# Patient Record
Sex: Male | Born: 1950
Health system: Southern US, Community
[De-identification: ages and names within clinical notes are randomized; demographics above are authoritative.]

## PROBLEM LIST (undated history)

## (undated) DIAGNOSIS — M1990 Unspecified osteoarthritis, unspecified site: Secondary | ICD-10-CM

## (undated) DIAGNOSIS — R972 Elevated prostate specific antigen [PSA]: Secondary | ICD-10-CM

## (undated) DIAGNOSIS — H3530 Unspecified macular degeneration: Secondary | ICD-10-CM

## (undated) DIAGNOSIS — E079 Disorder of thyroid, unspecified: Secondary | ICD-10-CM

## (undated) DIAGNOSIS — G629 Polyneuropathy, unspecified: Secondary | ICD-10-CM

## (undated) DIAGNOSIS — J45909 Unspecified asthma, uncomplicated: Secondary | ICD-10-CM

## (undated) DIAGNOSIS — G709 Myoneural disorder, unspecified: Secondary | ICD-10-CM

## (undated) DIAGNOSIS — E669 Obesity, unspecified: Secondary | ICD-10-CM

## (undated) DIAGNOSIS — M2550 Pain in unspecified joint: Secondary | ICD-10-CM

## (undated) DIAGNOSIS — K219 Gastro-esophageal reflux disease without esophagitis: Secondary | ICD-10-CM

## (undated) DIAGNOSIS — E039 Hypothyroidism, unspecified: Secondary | ICD-10-CM

## (undated) DIAGNOSIS — T7840XA Allergy, unspecified, initial encounter: Secondary | ICD-10-CM

## (undated) DIAGNOSIS — G4730 Sleep apnea, unspecified: Secondary | ICD-10-CM

## (undated) DIAGNOSIS — R0602 Shortness of breath: Secondary | ICD-10-CM

## (undated) DIAGNOSIS — I10 Essential (primary) hypertension: Secondary | ICD-10-CM

## (undated) HISTORY — DX: Elevated prostate specific antigen (PSA): R97.20

## (undated) HISTORY — PX: TONSILLECTOMY: SUR1361

## (undated) HISTORY — DX: Obesity, unspecified: E66.9

## (undated) HISTORY — PX: ESOPHAGOGASTRODUODENOSCOPY: SHX1529

## (undated) HISTORY — DX: Unspecified macular degeneration: H35.30

## (undated) HISTORY — PX: ANKLE SURGERY: SHX546

## (undated) HISTORY — PX: REPLACEMENT TOTAL KNEE: SUR1224

## (undated) HISTORY — DX: Allergy, unspecified, initial encounter: T78.40XA

## (undated) HISTORY — DX: Pain in unspecified joint: M25.50

## (undated) HISTORY — DX: Shortness of breath: R06.02

## (undated) HISTORY — PX: HAND SURGERY: SHX662

---

## 2000-04-02 ENCOUNTER — Ambulatory Visit (HOSPITAL_COMMUNITY): Admission: RE | Admit: 2000-04-02 | Discharge: 2000-04-02 | Payer: Self-pay

## 2000-07-22 ENCOUNTER — Other Ambulatory Visit: Admission: RE | Admit: 2000-07-22 | Discharge: 2000-07-22 | Payer: Self-pay | Admitting: Urology

## 2006-02-21 ENCOUNTER — Encounter: Admission: RE | Admit: 2006-02-21 | Discharge: 2006-02-21 | Payer: Self-pay | Admitting: Orthopedic Surgery

## 2013-04-30 ENCOUNTER — Ambulatory Visit: Payer: Self-pay | Admitting: Emergency Medicine

## 2013-04-30 VITALS — BP 124/68 | HR 74 | Temp 98.3°F | Resp 16 | Ht 72.5 in | Wt 296.0 lb

## 2013-04-30 DIAGNOSIS — H6120 Impacted cerumen, unspecified ear: Secondary | ICD-10-CM

## 2013-04-30 DIAGNOSIS — H6122 Impacted cerumen, left ear: Secondary | ICD-10-CM

## 2013-04-30 NOTE — Progress Notes (Signed)
[facility] [address], [place] [address] [phone]  Date:  04/30/2013   Name:  James Henson   DOB:  1950-08-24   MRN:  [id number]  PCP:  Lillia Mountain, MD    Chief Complaint: Ears clogged   History of Present Illness:  James Henson is a 62 y.o. very pleasant male patient who presents with the following:  Concerned that he may have a wax build up in his ears as he is going to the beach this weekend.  Has seasonal allergic rhinitis.  No fever or chills. Watery drainage. No sore throat or cough.  No improvement with over the counter medications or other home remedies. Denies other complaint or health concern today.   There are no active problems to display for this patient.   Past Medical History  Diagnosis Date  . Allergy     Past Surgical History  Procedure Laterality Date  . Hand surgery      History  Substance Use Topics  . Smoking status: Never Smoker   . Smokeless tobacco: Not on file  . Alcohol Use: No    History reviewed. No pertinent family history.  No Known Allergies  Medication list has been reviewed and updated.  No current outpatient prescriptions on file prior to visit.   No current facility-administered medications on file prior to visit.    Review of Systems:  As per HPI, otherwise negative.    Physical Examination: Filed Vitals:   04/30/13 1507  BP: 124/68  Pulse: 74  Temp: 98.3 F (36.8 C)  Resp: 16   Filed Vitals:   04/30/13 1507  Height: 6' 0.5" (1.842 m)  Weight: 296 lb (134.265 kg)   Body mass index is 39.57 kg/(m^2). Ideal Body Weight: Weight in (lb) to have BMI = 25: 186.5   GEN: WDWN, NAD, Non-toxic, Alert & Oriented x 3 HEENT: Atraumatic, Normocephalic.  Ears and Nose: No external deformity.  Left cerumen impaction EXTR: No clubbing/cyanosis/edema NEURO: Normal gait.  PSYCH: Normally interactive. Conversant. Not depressed or anxious appearing.  Calm demeanor.     Assessment and Plan: Cerumen impaction Irrigated atraumatically   Signed,  Phillips Odor, MD

## 2013-04-30 NOTE — Patient Instructions (Addendum)
Cerumen Impaction A cerumen impaction is when the wax in your ear forms a plug. This plug usually causes reduced hearing. Sometimes it also causes an earache or dizziness. Removing a cerumen impaction can be difficult and painful. The wax sticks to the ear canal. The canal is sensitive and bleeds easily. If you try to remove a heavy wax buildup with a cotton tipped swab, you may push it in further. Irrigation with water, suction, and small ear curettes may be used to clear out the wax. If the impaction is fixed to the skin in the ear canal, ear drops may be needed for a few days to loosen the wax. People who build up a lot of wax frequently can use ear wax removal products available in your local drugstore. SEEK MEDICAL CARE IF:  You develop an earache, increased hearing loss, or marked dizziness. Document Released: 09/10/2004 Document Revised: 10/26/2011 Document Reviewed: 10/31/2009 [employer] Patient Information 2014 [employer].  

## 2013-06-30 ENCOUNTER — Emergency Department (HOSPITAL_COMMUNITY): Payer: Self-pay

## 2013-06-30 ENCOUNTER — Inpatient Hospital Stay (HOSPITAL_COMMUNITY)
Admission: EM | Admit: 2013-06-30 | Discharge: 2013-07-05 | DRG: 200 | Disposition: A | Discharge status: Home / Self Care | Payer: Self-pay | Attending: General Surgery | Admitting: General Surgery

## 2013-06-30 ENCOUNTER — Encounter (HOSPITAL_COMMUNITY): Payer: Self-pay | Admitting: Emergency Medicine

## 2013-06-30 DIAGNOSIS — G589 Mononeuropathy, unspecified: Secondary | ICD-10-CM | POA: Diagnosis present

## 2013-06-30 DIAGNOSIS — S270XXA Traumatic pneumothorax, initial encounter: Secondary | ICD-10-CM

## 2013-06-30 DIAGNOSIS — S129XXA Fracture of neck, unspecified, initial encounter: Secondary | ICD-10-CM

## 2013-06-30 DIAGNOSIS — S22009A Unspecified fracture of unspecified thoracic vertebra, initial encounter for closed fracture: Secondary | ICD-10-CM

## 2013-06-30 DIAGNOSIS — J939 Pneumothorax, unspecified: Secondary | ICD-10-CM | POA: Diagnosis present

## 2013-06-30 DIAGNOSIS — E871 Hypo-osmolality and hyponatremia: Secondary | ICD-10-CM | POA: Diagnosis present

## 2013-06-30 DIAGNOSIS — S12400A Unspecified displaced fracture of fifth cervical vertebra, initial encounter for closed fracture: Secondary | ICD-10-CM | POA: Diagnosis present

## 2013-06-30 DIAGNOSIS — S2249XA Multiple fractures of ribs, unspecified side, initial encounter for closed fracture: Secondary | ICD-10-CM | POA: Diagnosis present

## 2013-06-30 DIAGNOSIS — R339 Retention of urine, unspecified: Secondary | ICD-10-CM | POA: Diagnosis present

## 2013-06-30 DIAGNOSIS — S2242XA Multiple fractures of ribs, left side, initial encounter for closed fracture: Secondary | ICD-10-CM

## 2013-06-30 HISTORY — DX: Polyneuropathy, unspecified: G62.9

## 2013-06-30 HISTORY — DX: Disorder of thyroid, unspecified: E07.9

## 2013-06-30 LAB — POCT I-STAT, CHEM 8
BUN: 22 mg/dL (ref 6–23)
Calcium, Ion: 1.25 mmol/L (ref 1.13–1.30)
Creatinine, Ser: 1.3 mg/dL (ref 0.50–1.35)
Glucose, Bld: 116 mg/dL — ABNORMAL HIGH (ref 70–99)
Sodium: 138 mEq/L (ref 135–145)

## 2013-06-30 LAB — CBC WITH DIFFERENTIAL/PLATELET
Basophils Absolute: 0 10*3/uL (ref 0.0–0.1)
Eosinophils Absolute: 0.1 10*3/uL (ref 0.0–0.7)
HCT: 46.7 % (ref 39.0–52.0)
Hemoglobin: 16.5 g/dL (ref 13.0–17.0)
MCHC: 35.3 g/dL (ref 30.0–36.0)
RDW: 13 % (ref 11.5–15.5)
WBC: 14.9 10*3/uL — ABNORMAL HIGH (ref 4.0–10.5)

## 2013-06-30 LAB — COMPREHENSIVE METABOLIC PANEL
ALT: 107 U/L — ABNORMAL HIGH (ref 0–53)
AST: 78 U/L — ABNORMAL HIGH (ref 0–37)
Alkaline Phosphatase: 55 U/L (ref 39–117)
BUN: 20 mg/dL (ref 6–23)
CO2: 27 mEq/L (ref 19–32)
Calcium: 9.3 mg/dL (ref 8.4–10.5)
Potassium: 3.9 mEq/L (ref 3.5–5.1)
Sodium: 135 mEq/L (ref 135–145)
Total Bilirubin: 0.6 mg/dL (ref 0.3–1.2)

## 2013-06-30 MED ORDER — GABAPENTIN 300 MG PO CAPS
300.0000 mg | ORAL_CAPSULE | Freq: Two times a day (BID) | ORAL | Status: DC
  Administered 2013-07-01 – 2013-07-05 (×9): 300 mg via ORAL
  Filled 2013-06-30 (×11): qty 1

## 2013-06-30 MED ORDER — SODIUM CHLORIDE 0.9 % IJ SOLN: 9.0000 mL | INTRAMUSCULAR | Status: DC | PRN

## 2013-06-30 MED ORDER — MIDAZOLAM HCL 2 MG/2ML IJ SOLN
2.0000 mg | Freq: Once | INTRAMUSCULAR | Status: AC
  Administered 2013-06-30: 2 mg via INTRAVENOUS
  Filled 2013-06-30: qty 2

## 2013-06-30 MED ORDER — PANTOPRAZOLE SODIUM 40 MG IV SOLR
40.0000 mg | Freq: Every day | INTRAVENOUS | Status: DC
  Administered 2013-07-01: 40 mg via INTRAVENOUS
  Filled 2013-06-30 (×4): qty 40

## 2013-06-30 MED ORDER — KCL IN DEXTROSE-NACL 20-5-0.45 MEQ/L-%-% IV SOLN
INTRAVENOUS | Status: DC
  Administered 2013-07-01: 01:00:00 via INTRAVENOUS
  Administered 2013-07-01: 75 mL via INTRAVENOUS
  Administered 2013-07-02: 05:00:00 via INTRAVENOUS
  Filled 2013-06-30 (×5): qty 1000

## 2013-06-30 MED ORDER — FLUTICASONE PROPIONATE 50 MCG/ACT NA SUSP
1.0000 | Freq: Every day | NASAL | Status: DC | PRN
  Filled 2013-06-30: qty 16

## 2013-06-30 MED ORDER — HYDROMORPHONE 0.3 MG/ML IV SOLN
INTRAVENOUS | Status: DC
  Administered 2013-06-30: via INTRAVENOUS
  Administered 2013-07-01: 2.7 mg via INTRAVENOUS
  Filled 2013-06-30: qty 25

## 2013-06-30 MED ORDER — DIPHENHYDRAMINE HCL 12.5 MG/5ML PO ELIX
12.5000 mg | ORAL_SOLUTION | Freq: Four times a day (QID) | ORAL | Status: DC | PRN
  Filled 2013-06-30: qty 5

## 2013-06-30 MED ORDER — ONDANSETRON HCL 4 MG/2ML IJ SOLN
4.0000 mg | Freq: Four times a day (QID) | INTRAMUSCULAR | Status: DC | PRN
  Administered 2013-07-01 (×2): 4 mg via INTRAVENOUS
  Filled 2013-06-30: qty 2

## 2013-06-30 MED ORDER — TRIAMCINOLONE ACETONIDE 55 MCG/ACT NA AERO
2.0000 | INHALATION_SPRAY | Freq: Every day | NASAL | Status: DC | PRN
  Filled 2013-06-30: qty 16.5

## 2013-06-30 MED ORDER — FENTANYL CITRATE 0.05 MG/ML IJ SOLN
100.0000 ug | Freq: Once | INTRAMUSCULAR | Status: AC
  Administered 2013-06-30: 100 ug via INTRAVENOUS
  Filled 2013-06-30: qty 2

## 2013-06-30 MED ORDER — ONDANSETRON HCL 4 MG/2ML IJ SOLN
4.0000 mg | Freq: Once | INTRAMUSCULAR | Status: AC
  Administered 2013-06-30: 4 mg via INTRAVENOUS
  Filled 2013-06-30: qty 2

## 2013-06-30 MED ORDER — PANTOPRAZOLE SODIUM 40 MG PO TBEC
40.0000 mg | DELAYED_RELEASE_TABLET | Freq: Every day | ORAL | Status: DC
  Administered 2013-07-02 – 2013-07-05 (×4): 40 mg via ORAL
  Filled 2013-06-30 (×4): qty 1

## 2013-06-30 MED ORDER — DIPHENHYDRAMINE HCL 50 MG/ML IJ SOLN: 12.5000 mg | Freq: Four times a day (QID) | INTRAMUSCULAR | Status: DC | PRN

## 2013-06-30 MED ORDER — DIPHENHYDRAMINE HCL 25 MG PO CAPS
25.0000 mg | ORAL_CAPSULE | Freq: Every evening | ORAL | Status: DC | PRN
  Filled 2013-06-30: qty 1

## 2013-06-30 MED ORDER — LISINOPRIL 10 MG PO TABS
10.0000 mg | ORAL_TABLET | Freq: Every day | ORAL | Status: DC
  Administered 2013-07-02 – 2013-07-05 (×4): 10 mg via ORAL
  Filled 2013-06-30 (×5): qty 1

## 2013-06-30 MED ORDER — IBUPROFEN 400 MG PO TABS
400.0000 mg | ORAL_TABLET | Freq: Every day | ORAL | Status: DC | PRN
  Filled 2013-06-30: qty 1

## 2013-06-30 MED ORDER — MONTELUKAST SODIUM 10 MG PO TABS
10.0000 mg | ORAL_TABLET | Freq: Every day | ORAL | Status: DC
  Administered 2013-07-01 – 2013-07-04 (×5): 10 mg via ORAL
  Filled 2013-06-30 (×6): qty 1

## 2013-06-30 MED ORDER — LEVOTHYROXINE SODIUM 75 MCG PO TABS
75.0000 ug | ORAL_TABLET | Freq: Every day | ORAL | Status: DC
  Administered 2013-07-02 – 2013-07-05 (×4): 75 ug via ORAL
  Filled 2013-06-30 (×6): qty 1

## 2013-06-30 MED ORDER — LISINOPRIL-HYDROCHLOROTHIAZIDE 10-12.5 MG PO TABS: 1.0000 | ORAL_TABLET | Freq: Every day | ORAL | Status: DC

## 2013-06-30 MED ORDER — ENOXAPARIN SODIUM 40 MG/0.4ML ~~LOC~~ SOLN
40.0000 mg | SUBCUTANEOUS | Status: DC
  Administered 2013-07-01 – 2013-07-05 (×5): 40 mg via SUBCUTANEOUS
  Filled 2013-06-30 (×5): qty 0.4

## 2013-06-30 MED ORDER — MORPHINE SULFATE 4 MG/ML IJ SOLN
4.0000 mg | Freq: Once | INTRAMUSCULAR | Status: AC
  Administered 2013-06-30: 4 mg via INTRAVENOUS
  Filled 2013-06-30: qty 1

## 2013-06-30 MED ORDER — ONDANSETRON HCL 4 MG/2ML IJ SOLN
4.0000 mg | Freq: Four times a day (QID) | INTRAMUSCULAR | Status: DC | PRN
  Filled 2013-06-30: qty 2

## 2013-06-30 MED ORDER — HYDROCHLOROTHIAZIDE 12.5 MG PO CAPS
12.5000 mg | ORAL_CAPSULE | Freq: Every day | ORAL | Status: DC
  Administered 2013-07-02 – 2013-07-05 (×4): 12.5 mg via ORAL
  Filled 2013-06-30 (×5): qty 1

## 2013-06-30 MED ORDER — IOHEXOL 300 MG/ML  SOLN
100.0000 mL | Freq: Once | INTRAMUSCULAR | Status: AC | PRN
  Administered 2013-06-30: 100 mL via INTRAVENOUS

## 2013-06-30 MED ORDER — NALOXONE HCL 0.4 MG/ML IJ SOLN: 0.4000 mg | INTRAMUSCULAR | Status: DC | PRN

## 2013-06-30 MED ORDER — ONDANSETRON HCL 4 MG PO TABS
4.0000 mg | ORAL_TABLET | Freq: Four times a day (QID) | ORAL | Status: DC | PRN
  Filled 2013-06-30: qty 1
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
## 2013-06-30 NOTE — Procedures (Signed)
Chest Tube Insertion Procedure Note  Indications:  Clinically significant left pneumothorax  Pre-operative Diagnosis: left rib fractures 3 through 9 with pneumothorax  Post-operative Diagnosis: same  Procedure Details  Informed consent was obtained for the procedure, including sedation.  Risks of lung perforation, hemorrhage, arrhythmia, and adverse drug reaction were discussed. He was given fentanyl and Versed. He was monitored throughout.  After sterile skin prep, using standard technique, a 20 French tube was placed in the left anterior axillary line, nipple level Findings: Large rush of air, no significant blood  Estimated Blood Loss:  Minimal         Specimens:  None              Complications:  None; patient tolerated the procedure well.         Disposition: will be admitted to the ICU         Condition: stable   Violeta Gelinas, MD, MPH, FACS Pager: [phone]

## 2013-07-01 ENCOUNTER — Inpatient Hospital Stay (HOSPITAL_COMMUNITY): Payer: Self-pay

## 2013-07-01 LAB — BASIC METABOLIC PANEL
BUN: 22 mg/dL (ref 6–23)
Calcium: 9.1 mg/dL (ref 8.4–10.5)
Chloride: 99 mEq/L (ref 96–112)
Creatinine, Ser: 0.79 mg/dL (ref 0.50–1.35)
GFR calc Af Amer: >90 mL/min (ref >90)
GFR calc non Af Amer: >90 mL/min (ref >90)
Glucose, Bld: 211 mg/dL — ABNORMAL HIGH (ref 70–99)
Potassium: 4.1 mEq/L (ref 3.5–5.1)

## 2013-07-01 LAB — MRSA PCR SCREENING: MRSA by PCR: NEGATIVE

## 2013-07-01 LAB — CBC
HCT: 46.5 % (ref 39.0–52.0)
Hemoglobin: 16.1 g/dL (ref 13.0–17.0)
MCH: 31 pg (ref 26.0–34.0)
MCHC: 34.6 g/dL (ref 30.0–36.0)
MCV: 89.6 fL (ref 78.0–100.0)
Platelets: 165 10*3/uL (ref 150–400)
RDW: 12.9 % (ref 11.5–15.5)

## 2013-07-01 MED ORDER — NALOXONE HCL 0.4 MG/ML IJ SOLN: 0.4000 mg | INTRAMUSCULAR | Status: DC | PRN

## 2013-07-01 MED ORDER — DIPHENHYDRAMINE HCL 12.5 MG/5ML PO ELIX
12.5000 mg | ORAL_SOLUTION | Freq: Four times a day (QID) | ORAL | Status: DC | PRN
  Filled 2013-07-01: qty 5

## 2013-07-01 MED ORDER — BIOTENE DRY MOUTH MT LIQD
15.0000 mL | Freq: Two times a day (BID) | OROMUCOSAL | Status: DC
  Administered 2013-07-01 – 2013-07-04 (×7): 15 mL via OROMUCOSAL

## 2013-07-01 MED ORDER — ONDANSETRON HCL 4 MG/2ML IJ SOLN: 4.0000 mg | Freq: Four times a day (QID) | INTRAMUSCULAR | Status: DC | PRN

## 2013-07-01 MED ORDER — DIPHENHYDRAMINE HCL 50 MG/ML IJ SOLN: 12.5000 mg | Freq: Four times a day (QID) | INTRAMUSCULAR | Status: DC | PRN

## 2013-07-01 MED ORDER — MORPHINE SULFATE (PF) 1 MG/ML IV SOLN
INTRAVENOUS | Status: DC
  Administered 2013-07-01: 1.5 mg via INTRAVENOUS
  Administered 2013-07-01: 4.5 mg via INTRAVENOUS
  Administered 2013-07-02: 02:00:00 via INTRAVENOUS
  Administered 2013-07-02: 3 mg via INTRAVENOUS
  Administered 2013-07-02: 4.5 mg via INTRAVENOUS
  Administered 2013-07-02: 6.92 mg via INTRAVENOUS
  Administered 2013-07-02: 4.5 mg via INTRAVENOUS
  Administered 2013-07-03: 2.45 mg via INTRAVENOUS
  Administered 2013-07-03: 6.59 mg via INTRAVENOUS
  Administered 2013-07-03: 6 mg via INTRAVENOUS
  Administered 2013-07-03: 06:00:00 via INTRAVENOUS
  Filled 2013-07-01 (×3): qty 25

## 2013-07-01 MED ORDER — PROMETHAZINE HCL 25 MG/ML IJ SOLN
6.2500 mg | Freq: Four times a day (QID) | INTRAMUSCULAR | Status: DC | PRN
  Administered 2013-07-01 – 2013-07-03 (×4): 6.25 mg via INTRAVENOUS
  Administered 2013-07-04: 12.5 mg via INTRAVENOUS
  Filled 2013-07-01 (×6): qty 1

## 2013-07-01 MED ORDER — SODIUM CHLORIDE 0.9 % IJ SOLN: 9.0000 mL | INTRAMUSCULAR | Status: DC | PRN

## 2013-07-01 MED ORDER — BIOTENE DRY MOUTH MT LIQD: 15.0000 mL | OROMUCOSAL | Status: DC | PRN
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
## 2013-07-01 NOTE — Progress Notes (Signed)
Cont current care.  CXR in am.
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
## 2013-07-02 ENCOUNTER — Inpatient Hospital Stay (HOSPITAL_COMMUNITY): Payer: Self-pay

## 2013-07-02 DIAGNOSIS — R7309 Other abnormal glucose: Secondary | ICD-10-CM

## 2013-07-02 MED ORDER — OXYCODONE HCL 5 MG PO TABS
5.0000 mg | ORAL_TABLET | ORAL | Status: DC | PRN
  Administered 2013-07-02: 5 mg via ORAL
  Administered 2013-07-03 – 2013-07-05 (×10): 10 mg via ORAL
  Filled 2013-07-02: qty 1
  Filled 2013-07-02 (×10): qty 2

## 2013-07-02 MED ORDER — TRAMADOL HCL 50 MG PO TABS
50.0000 mg | ORAL_TABLET | Freq: Four times a day (QID) | ORAL | Status: DC
  Administered 2013-07-02 – 2013-07-05 (×11): 50 mg via ORAL
  Filled 2013-07-02 (×11): qty 1

## 2013-07-02 MED ORDER — SODIUM CHLORIDE 0.9 % IV SOLN
INTRAVENOUS | Status: DC
  Administered 2013-07-02: 11:00:00 via INTRAVENOUS
  Filled 2013-07-02 (×4): qty 1000

## 2013-07-02 MED ORDER — SODIUM CHLORIDE 0.45 % IV SOLN: INTRAVENOUS | Status: DC
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
## 2013-07-03 ENCOUNTER — Inpatient Hospital Stay (HOSPITAL_COMMUNITY): Payer: Self-pay

## 2013-07-03 DIAGNOSIS — R339 Retention of urine, unspecified: Secondary | ICD-10-CM

## 2013-07-03 LAB — BASIC METABOLIC PANEL
BUN: 13 mg/dL (ref 6–23)
Calcium: 9.4 mg/dL (ref 8.4–10.5)
GFR calc Af Amer: >90 mL/min (ref >90)
GFR calc non Af Amer: >90 mL/min (ref >90)
Glucose, Bld: 132 mg/dL — ABNORMAL HIGH (ref 70–99)
Potassium: 3.8 mEq/L (ref 3.5–5.1)

## 2013-07-03 LAB — CBC
HCT: 46.4 % (ref 39.0–52.0)
MCHC: 34.5 g/dL (ref 30.0–36.0)
Platelets: 156 10*3/uL (ref 150–400)
RDW: 13.3 % (ref 11.5–15.5)

## 2013-07-03 MED ORDER — ALBUTEROL SULFATE (5 MG/ML) 0.5% IN NEBU
2.5000 mg | INHALATION_SOLUTION | Freq: Three times a day (TID) | RESPIRATORY_TRACT | Status: DC
  Administered 2013-07-04 – 2013-07-05 (×4): 2.5 mg via RESPIRATORY_TRACT
  Filled 2013-07-03 (×4): qty 0.5

## 2013-07-03 MED ORDER — TAMSULOSIN HCL 0.4 MG PO CAPS
0.4000 mg | ORAL_CAPSULE | Freq: Every day | ORAL | Status: DC
  Administered 2013-07-03 – 2013-07-05 (×3): 0.4 mg via ORAL
  Filled 2013-07-03 (×4): qty 1

## 2013-07-03 MED ORDER — IPRATROPIUM BROMIDE 0.02 % IN SOLN
0.5000 mg | Freq: Three times a day (TID) | RESPIRATORY_TRACT | Status: DC
  Administered 2013-07-04 – 2013-07-05 (×4): 0.5 mg via RESPIRATORY_TRACT
  Filled 2013-07-03 (×4): qty 2.5

## 2013-07-03 MED ORDER — IPRATROPIUM BROMIDE 0.02 % IN SOLN
RESPIRATORY_TRACT | Status: AC
  Filled 2013-07-03: qty 2.5

## 2013-07-03 MED ORDER — SENNA 8.6 MG PO TABS
1.0000 | ORAL_TABLET | Freq: Every day | ORAL | Status: DC | PRN
  Filled 2013-07-03: qty 1

## 2013-07-03 MED ORDER — IPRATROPIUM BROMIDE 0.02 % IN SOLN
0.5000 mg | RESPIRATORY_TRACT | Status: DC
  Administered 2013-07-03 (×2): 0.5 mg via RESPIRATORY_TRACT
  Filled 2013-07-03 (×2): qty 2.5

## 2013-07-03 MED ORDER — ALBUTEROL SULFATE (5 MG/ML) 0.5% IN NEBU
2.5000 mg | INHALATION_SOLUTION | RESPIRATORY_TRACT | Status: DC
  Administered 2013-07-03 (×2): 2.5 mg via RESPIRATORY_TRACT
  Filled 2013-07-03 (×2): qty 0.5

## 2013-07-03 MED ORDER — MORPHINE SULFATE 2 MG/ML IJ SOLN
1.0000 mg | INTRAMUSCULAR | Status: DC | PRN
  Administered 2013-07-03 – 2013-07-04 (×4): 2 mg via INTRAVENOUS
  Filled 2013-07-03 (×4): qty 1

## 2013-07-03 MED ORDER — ALBUTEROL SULFATE (5 MG/ML) 0.5% IN NEBU
INHALATION_SOLUTION | RESPIRATORY_TRACT | Status: AC
  Filled 2013-07-03: qty 0.5

## 2013-07-03 MED ORDER — LORATADINE 10 MG PO TABS
10.0000 mg | ORAL_TABLET | Freq: Every day | ORAL | Status: DC
  Administered 2013-07-03 – 2013-07-05 (×3): 10 mg via ORAL
  Filled 2013-07-03 (×4): qty 1

## 2013-07-03 MED ORDER — DOCUSATE SODIUM 100 MG PO CAPS
100.0000 mg | ORAL_CAPSULE | Freq: Two times a day (BID) | ORAL | Status: DC
  Administered 2013-07-03 – 2013-07-05 (×5): 100 mg via ORAL
  Filled 2013-07-03 (×5): qty 1
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
## 2013-07-03 NOTE — Progress Notes (Signed)
Pt attemtped to void unable to void, bladder scanned for only 69cc. Pt on flomax. Will continue to monitor.

## 2013-07-03 NOTE — Progress Notes (Signed)
PT Cancellation Note  Patient Details Name: James Henson MRN: [id number] DOB: 05-03-1951   Cancelled Treatment:    Reason Eval/Treat Not Completed:  (pt declined due to desiring to visit with pastor.) Pt strongly desired to visit with pastor and asked for PT to return. PT to return as able.   Marcene Brawn 07/03/2013, 11:39 AM
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
## 2013-07-03 NOTE — Progress Notes (Signed)
No air leak, but patient not moving a lot of air.  CXR okay.  Should be okay for this patient to go to the SDU.  This patient has been seen and I agree with the findings and treatment plan.  Marta Lamas. Gae Bon, MD, FACS [phone] (pager) [phone] (direct pager) Trauma Surgeon

## 2013-07-03 NOTE — Progress Notes (Signed)
Report called to 3S RN.  Will transport via wheelchair. James Henson C
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
## 2013-07-04 ENCOUNTER — Inpatient Hospital Stay (HOSPITAL_COMMUNITY): Payer: Self-pay

## 2013-07-04 LAB — BASIC METABOLIC PANEL
BUN: 18 mg/dL (ref 6–23)
Calcium: 9.7 mg/dL (ref 8.4–10.5)
Chloride: 92 mEq/L — ABNORMAL LOW (ref 96–112)
Creatinine, Ser: 1.2 mg/dL (ref 0.50–1.35)
GFR calc Af Amer: 73 mL/min — ABNORMAL LOW (ref >90)
Glucose, Bld: 141 mg/dL — ABNORMAL HIGH (ref 70–99)
Potassium: 4.1 mEq/L (ref 3.5–5.1)

## 2013-07-04 MED ORDER — MORPHINE SULFATE 2 MG/ML IJ SOLN
1.0000 mg | INTRAMUSCULAR | Status: DC | PRN
  Administered 2013-07-04 – 2013-07-05 (×2): 2 mg via INTRAVENOUS
  Filled 2013-07-04 (×2): qty 1

## 2013-07-04 MED ORDER — BETHANECHOL CHLORIDE 25 MG PO TABS
25.0000 mg | ORAL_TABLET | Freq: Four times a day (QID) | ORAL | Status: DC
  Administered 2013-07-04 – 2013-07-05 (×6): 25 mg via ORAL
  Filled 2013-07-04 (×8): qty 1
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
## 2013-07-04 NOTE — Progress Notes (Signed)
Pt transferred to 6N12 per MD order. Report called to receiving nurse and all questions answered.  
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
## 2013-07-04 NOTE — Progress Notes (Signed)
CT out. Standing in room getting ready to transfer. Lungs CTA. Work on pain control. Patient examined and I agree with the assessment and plan I also spoke to his son. Violeta Gelinas, MD, MPH, FACS Pager: [phone]  07/04/2013 1:14 PM

## 2013-07-04 NOTE — Progress Notes (Signed)
Pt has voided twice this  Shift 200  And then 100 pt still is having trouble voiding.  Takes him 10-15 min to get stream started  Bladder scanned pt at 0615 which showed pt had 240-253 in bladder. Pt states he feels like he needs pee, but is not yet uncomfortable, will rescan pt at 0800

## 2013-07-04 NOTE — Progress Notes (Signed)
Last remaining chest tube d/c'd per MD order. Pt tolerated procedure well. Will continue to monitor.

## 2013-07-05 ENCOUNTER — Inpatient Hospital Stay (HOSPITAL_COMMUNITY): Payer: Self-pay

## 2013-07-05 DIAGNOSIS — G629 Polyneuropathy, unspecified: Secondary | ICD-10-CM | POA: Insufficient documentation

## 2013-07-05 DIAGNOSIS — E079 Disorder of thyroid, unspecified: Secondary | ICD-10-CM | POA: Insufficient documentation

## 2013-07-05 DIAGNOSIS — R339 Retention of urine, unspecified: Secondary | ICD-10-CM | POA: Diagnosis present

## 2013-07-05 DIAGNOSIS — T7840XA Allergy, unspecified, initial encounter: Secondary | ICD-10-CM | POA: Insufficient documentation

## 2013-07-05 DIAGNOSIS — S22009A Unspecified fracture of unspecified thoracic vertebra, initial encounter for closed fracture: Secondary | ICD-10-CM

## 2013-07-05 MED ORDER — BETHANECHOL CHLORIDE 25 MG PO TABS: ORAL_TABLET | ORAL | Status: DC

## 2013-07-05 MED ORDER — IBUPROFEN 200 MG PO TABS: 800.0000 mg | ORAL_TABLET | Freq: Three times a day (TID) | ORAL | Status: DC

## 2013-07-05 MED ORDER — OXYCODONE-ACETAMINOPHEN 5-325 MG PO TABS: 1.0000 | ORAL_TABLET | ORAL | Status: DC | PRN

## 2013-07-05 MED ORDER — TAMSULOSIN HCL 0.4 MG PO CAPS: 0.4000 mg | ORAL_CAPSULE | Freq: Every day | ORAL | Status: DC

## 2013-07-05 MED ORDER — TRAMADOL HCL 50 MG PO TABS: 100.0000 mg | ORAL_TABLET | Freq: Four times a day (QID) | ORAL | Status: DC
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
## 2013-07-05 NOTE — Progress Notes (Signed)
Seen and agreed 07/05/2013 Robinette, Julia Elizabeth PTA [phone] pager [phone] office    

## 2013-07-05 NOTE — Discharge Summary (Signed)
This patient has been seen and I agree with the findings and treatment plan.  Natavia Sublette O. Jerae Izard, III, MD, FACS [phone] (pager) [phone] (direct pager) Trauma Surgeon  
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
## 2013-07-05 NOTE — Progress Notes (Signed)
This patient has been seen and I agree with the findings and treatment plan.  Dina Warbington O. Amariya Liskey, III, MD, FACS [phone] (pager) [phone] (direct pager) Trauma Surgeon  

## 2013-07-05 NOTE — Progress Notes (Addendum)
Pt with orders for rolling walker and 3-in-1 bedside commode. Spoke with pt, who currently has no insurance coverage, he felt that he only needed the walker.  Referral called to [facility] with [facility], [phone], for discharge today. Encouraged pt to pick up any additional DME needed for toileting at a local medical supply store if he changed his mind when he gets home--pt stated understanding.

## 2013-07-06 ENCOUNTER — Telehealth (HOSPITAL_COMMUNITY): Payer: Self-pay | Admitting: Emergency Medicine

## 2013-07-06 MED ORDER — ONDANSETRON HCL 4 MG PO TABS: 4.0000 mg | ORAL_TABLET | ORAL | Status: DC | PRN

## 2013-07-06 NOTE — Telephone Encounter (Signed)
Sent in rx for Zofran to help with nausea.

## 2013-07-08 ENCOUNTER — Inpatient Hospital Stay (HOSPITAL_COMMUNITY): Payer: Self-pay

## 2013-07-08 ENCOUNTER — Emergency Department (HOSPITAL_COMMUNITY): Payer: Self-pay

## 2013-07-08 ENCOUNTER — Encounter (HOSPITAL_COMMUNITY): Payer: Self-pay | Admitting: Emergency Medicine

## 2013-07-08 ENCOUNTER — Inpatient Hospital Stay (HOSPITAL_COMMUNITY)
Admission: EM | Admit: 2013-07-08 | Discharge: 2013-07-18 | DRG: 389 | Disposition: A | Discharge status: Home / Self Care | Payer: Self-pay | Attending: General Surgery | Admitting: General Surgery

## 2013-07-08 DIAGNOSIS — E871 Hypo-osmolality and hyponatremia: Secondary | ICD-10-CM | POA: Diagnosis present

## 2013-07-08 DIAGNOSIS — M7989 Other specified soft tissue disorders: Secondary | ICD-10-CM

## 2013-07-08 DIAGNOSIS — K567 Ileus, unspecified: Secondary | ICD-10-CM | POA: Diagnosis present

## 2013-07-08 DIAGNOSIS — R109 Unspecified abdominal pain: Secondary | ICD-10-CM

## 2013-07-08 DIAGNOSIS — S2249XA Multiple fractures of ribs, unspecified side, initial encounter for closed fracture: Secondary | ICD-10-CM

## 2013-07-08 DIAGNOSIS — K560 Paralytic ileus: Principal | ICD-10-CM | POA: Diagnosis present

## 2013-07-08 DIAGNOSIS — E669 Obesity, unspecified: Secondary | ICD-10-CM | POA: Diagnosis present

## 2013-07-08 DIAGNOSIS — E876 Hypokalemia: Secondary | ICD-10-CM | POA: Diagnosis not present

## 2013-07-08 DIAGNOSIS — J90 Pleural effusion, not elsewhere classified: Secondary | ICD-10-CM | POA: Diagnosis present

## 2013-07-08 DIAGNOSIS — Z79899 Other long term (current) drug therapy: Secondary | ICD-10-CM

## 2013-07-08 DIAGNOSIS — G4700 Insomnia, unspecified: Secondary | ICD-10-CM | POA: Diagnosis not present

## 2013-07-08 HISTORY — DX: Unspecified asthma, uncomplicated: J45.909

## 2013-07-08 LAB — URINALYSIS, ROUTINE W REFLEX MICROSCOPIC
Glucose, UA: NEGATIVE mg/dL
Hgb urine dipstick: NEGATIVE
Ketones, ur: 15 mg/dL — AB
Protein, ur: NEGATIVE mg/dL
Urobilinogen, UA: 1 mg/dL (ref 0.0–1.0)

## 2013-07-08 LAB — CBC WITH DIFFERENTIAL/PLATELET
Eosinophils Absolute: 0 10*3/uL (ref 0.0–0.7)
Eosinophils Relative: 0 % (ref 0–5)
Hemoglobin: 16.1 g/dL (ref 13.0–17.0)
Lymphs Abs: 0.6 10*3/uL — ABNORMAL LOW (ref 0.7–4.0)
MCH: 31.3 pg (ref 26.0–34.0)
MCV: 84.9 fL (ref 78.0–100.0)
Monocytes Relative: 6 % (ref 3–12)
RBC: 5.15 MIL/uL (ref 4.22–5.81)
RDW: 12.9 % (ref 11.5–15.5)

## 2013-07-08 LAB — POCT I-STAT, CHEM 8
BUN: 25 mg/dL — ABNORMAL HIGH (ref 6–23)
Chloride: 85 mEq/L — ABNORMAL LOW (ref 96–112)
Creatinine, Ser: 1.3 mg/dL (ref 0.50–1.35)
Glucose, Bld: 202 mg/dL — ABNORMAL HIGH (ref 70–99)
Hemoglobin: 15.6 g/dL (ref 13.0–17.0)
Sodium: 124 mEq/L — ABNORMAL LOW (ref 135–145)
TCO2: 26 mmol/L (ref 0–100)

## 2013-07-08 MED ORDER — FLEET ENEMA 7-19 GM/118ML RE ENEM
1.0000 | ENEMA | Freq: Once | RECTAL | Status: AC
  Administered 2013-07-08: 16:00:00 via RECTAL
  Filled 2013-07-08 (×2): qty 1

## 2013-07-08 MED ORDER — PANTOPRAZOLE SODIUM 40 MG PO TBEC
40.0000 mg | DELAYED_RELEASE_TABLET | Freq: Every day | ORAL | Status: DC
  Administered 2013-07-14 – 2013-07-18 (×5): 40 mg via ORAL
  Filled 2013-07-08 (×5): qty 1

## 2013-07-08 MED ORDER — TRIAMCINOLONE ACETONIDE 55 MCG/ACT NA AERO: 2.0000 | INHALATION_SPRAY | Freq: Every day | NASAL | Status: DC | PRN

## 2013-07-08 MED ORDER — ENOXAPARIN SODIUM 40 MG/0.4ML ~~LOC~~ SOLN
40.0000 mg | SUBCUTANEOUS | Status: DC
  Administered 2013-07-08 – 2013-07-09 (×2): 40 mg via SUBCUTANEOUS
  Filled 2013-07-08 (×5): qty 0.4

## 2013-07-08 MED ORDER — MONTELUKAST SODIUM 10 MG PO TABS
10.0000 mg | ORAL_TABLET | Freq: Every day | ORAL | Status: DC
  Administered 2013-07-08 – 2013-07-17 (×10): 10 mg via ORAL
  Filled 2013-07-08 (×17): qty 1

## 2013-07-08 MED ORDER — SODIUM CHLORIDE 0.9 % IV SOLN
INTRAVENOUS | Status: DC
  Administered 2013-07-08 – 2013-07-10 (×5): via INTRAVENOUS
  Administered 2013-07-10: 125 mL/h via INTRAVENOUS
  Administered 2013-07-11: 100 mL/h via INTRAVENOUS
  Administered 2013-07-11: 1000 mL via INTRAVENOUS
  Administered 2013-07-11: 11:00:00 via INTRAVENOUS
  Administered 2013-07-12: 1000 mL via INTRAVENOUS
  Administered 2013-07-12 – 2013-07-17 (×7): via INTRAVENOUS

## 2013-07-08 MED ORDER — LORAZEPAM 2 MG/ML IJ SOLN
2.0000 mg | Freq: Once | INTRAMUSCULAR | Status: AC
  Administered 2013-07-08: 2 mg via INTRAVENOUS

## 2013-07-08 MED ORDER — IOHEXOL 300 MG/ML  SOLN
100.0000 mL | Freq: Once | INTRAMUSCULAR | Status: AC | PRN
  Administered 2013-07-08: 100 mL via INTRAVENOUS

## 2013-07-08 MED ORDER — HYDROMORPHONE HCL PF 1 MG/ML IJ SOLN
1.0000 mg | INTRAMUSCULAR | Status: DC | PRN
  Administered 2013-07-08: 1 mg via INTRAVENOUS
  Administered 2013-07-09 (×2): 2 mg via INTRAVENOUS
  Administered 2013-07-09: 1 mg via INTRAVENOUS
  Administered 2013-07-09 – 2013-07-10 (×7): 2 mg via INTRAVENOUS
  Administered 2013-07-11 (×2): 1 mg via INTRAVENOUS
  Filled 2013-07-08: qty 1
  Filled 2013-07-08: qty 2
  Filled 2013-07-08: qty 1
  Filled 2013-07-08: qty 2
  Filled 2013-07-08: qty 1
  Filled 2013-07-08 (×5): qty 2
  Filled 2013-07-08: qty 1
  Filled 2013-07-08 (×2): qty 2

## 2013-07-08 MED ORDER — LIDOCAINE VISCOUS 2 % MT SOLN
15.0000 mL | Freq: Once | OROMUCOSAL | Status: AC
  Administered 2013-07-08: 15 mL via OROMUCOSAL

## 2013-07-08 MED ORDER — LORAZEPAM 2 MG/ML IJ SOLN
INTRAMUSCULAR | Status: AC
  Filled 2013-07-08: qty 1

## 2013-07-08 MED ORDER — BISACODYL 10 MG RE SUPP
10.0000 mg | Freq: Every day | RECTAL | Status: DC | PRN
  Administered 2013-07-10: 10 mg via RECTAL
  Filled 2013-07-08: qty 1

## 2013-07-08 MED ORDER — ONDANSETRON HCL 4 MG PO TABS: 4.0000 mg | ORAL_TABLET | Freq: Four times a day (QID) | ORAL | Status: DC | PRN

## 2013-07-08 MED ORDER — FLUTICASONE PROPIONATE 50 MCG/ACT NA SUSP
1.0000 | Freq: Every day | NASAL | Status: DC | PRN
  Filled 2013-07-08: qty 16

## 2013-07-08 MED ORDER — ONDANSETRON HCL 4 MG/2ML IJ SOLN
4.0000 mg | Freq: Four times a day (QID) | INTRAMUSCULAR | Status: DC | PRN
  Administered 2013-07-08 – 2013-07-09 (×3): 4 mg via INTRAVENOUS
  Filled 2013-07-08 (×3): qty 2

## 2013-07-08 MED ORDER — PANTOPRAZOLE SODIUM 40 MG IV SOLR
40.0000 mg | Freq: Every day | INTRAVENOUS | Status: DC
  Administered 2013-07-08 – 2013-07-13 (×6): 40 mg via INTRAVENOUS
  Filled 2013-07-08 (×9): qty 40

## 2013-07-08 MED ORDER — FENTANYL CITRATE 0.05 MG/ML IJ SOLN
100.0000 ug | Freq: Once | INTRAMUSCULAR | Status: AC
  Administered 2013-07-08: 100 ug via INTRAVENOUS
  Filled 2013-07-08: qty 2
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
## 2013-07-08 NOTE — ED Notes (Signed)
NG tube insertion successful, brown colored gastric secretions noted upon insertion, verified by ascultation. Pt resting quietly at the time. Vital signs stable.

## 2013-07-08 NOTE — Progress Notes (Signed)
VASCULAR LAB PRELIMINARY  PRELIMINARY  PRELIMINARY  PRELIMINARY  Bilateral lower extremity venous Dopplers completed.    Preliminary report:  There is no DVT or SVT noted in the bilateral lower extremities.  Wakisha Alberts, RVT 07/08/2013, 10:28 AM

## 2013-07-08 NOTE — ED Notes (Signed)
X-ray at bedside to verify NG tube placement. Pt resting quietly at the time. Vital signs stable.
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
## 2013-07-08 NOTE — ED Notes (Signed)
Wife and daughter leaving and would like to be notified of any changes in the plan of care

## 2013-07-09 ENCOUNTER — Inpatient Hospital Stay (HOSPITAL_COMMUNITY): Payer: Self-pay

## 2013-07-09 DIAGNOSIS — K567 Ileus, unspecified: Secondary | ICD-10-CM | POA: Diagnosis present

## 2013-07-09 LAB — BASIC METABOLIC PANEL
BUN: 22 mg/dL (ref 6–23)
CO2: 30 mEq/L (ref 19–32)
Calcium: 8.9 mg/dL (ref 8.4–10.5)
Creatinine, Ser: 0.99 mg/dL (ref 0.50–1.35)
GFR calc Af Amer: >90 mL/min (ref >90)
GFR calc non Af Amer: 85 mL/min — ABNORMAL LOW (ref >90)

## 2013-07-09 LAB — CBC
HCT: 39 % (ref 39.0–52.0)
MCH: 31 pg (ref 26.0–34.0)
MCV: 87.1 fL (ref 78.0–100.0)
Platelets: 280 10*3/uL (ref 150–400)
RBC: 4.48 MIL/uL (ref 4.22–5.81)
RDW: 12.9 % (ref 11.5–15.5)

## 2013-07-09 MED ORDER — BIOTENE DRY MOUTH MT LIQD
15.0000 mL | Freq: Two times a day (BID) | OROMUCOSAL | Status: DC
  Administered 2013-07-09 – 2013-07-12 (×3): 15 mL via OROMUCOSAL

## 2013-07-09 MED ORDER — CHLORHEXIDINE GLUCONATE 0.12 % MT SOLN
15.0000 mL | Freq: Two times a day (BID) | OROMUCOSAL | Status: DC
  Administered 2013-07-09 – 2013-07-13 (×8): 15 mL via OROMUCOSAL
  Filled 2013-07-09 (×9): qty 15
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
## 2013-07-10 DIAGNOSIS — E871 Hypo-osmolality and hyponatremia: Secondary | ICD-10-CM

## 2013-07-10 LAB — BASIC METABOLIC PANEL
CO2: 29 mEq/L (ref 19–32)
Calcium: 8.9 mg/dL (ref 8.4–10.5)
Creatinine, Ser: 1.03 mg/dL (ref 0.50–1.35)
GFR calc Af Amer: 87 mL/min — ABNORMAL LOW (ref >90)
GFR calc non Af Amer: 75 mL/min — ABNORMAL LOW (ref >90)
Glucose, Bld: 105 mg/dL — ABNORMAL HIGH (ref 70–99)
Sodium: 129 mEq/L — ABNORMAL LOW (ref 135–145)

## 2013-07-10 MED ORDER — ENOXAPARIN SODIUM 40 MG/0.4ML ~~LOC~~ SOLN
40.0000 mg | Freq: Two times a day (BID) | SUBCUTANEOUS | Status: DC
  Administered 2013-07-10 – 2013-07-18 (×17): 40 mg via SUBCUTANEOUS
  Filled 2013-07-10 (×24): qty 0.4

## 2013-07-10 NOTE — Progress Notes (Signed)
Has some BS. Continue NGT. Ileus should resolve with time. Check Na. Patient examined and I agree with the assessment and plan  Violeta Gelinas, MD, MPH, FACS Pager: [phone]  07/10/2013 9:55 AM
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
## 2013-07-11 ENCOUNTER — Inpatient Hospital Stay (HOSPITAL_COMMUNITY): Payer: Self-pay

## 2013-07-11 LAB — BASIC METABOLIC PANEL
BUN: 14 mg/dL (ref 6–23)
Calcium: 9.1 mg/dL (ref 8.4–10.5)
GFR calc Af Amer: >90 mL/min (ref >90)
GFR calc non Af Amer: 89 mL/min — ABNORMAL LOW (ref >90)
Glucose, Bld: 86 mg/dL (ref 70–99)
Potassium: 4.2 mEq/L (ref 3.5–5.1)

## 2013-07-11 MED ORDER — TRAMADOL HCL 50 MG PO TABS
100.0000 mg | ORAL_TABLET | Freq: Four times a day (QID) | ORAL | Status: DC
  Administered 2013-07-11 – 2013-07-16 (×21): 100 mg via ORAL
  Filled 2013-07-11 (×21): qty 2

## 2013-07-11 NOTE — Progress Notes (Signed)
Patient ID: James Henson, male   DOB: 1950/12/20, 62 y.o.   MRN: [id number]   LOS: 3 days   Subjective: Rough night secondary to pain. Denies N/V but is more distended this am. Had small BM this morning.   Objective: Vital signs in last 24 hours: Temp:  [97.7 F (36.5 C)-98.6 F (37 C)] 97.7 F (36.5 C) (11/25 0557) Pulse Rate:  [79-81] 81 (11/25 0557) Resp:  [15-18] 15 (11/25 0557) BP: (140-162)/(77-86) 162/86 mmHg (11/25 0557) SpO2:  [98 %-100 %] 100 % (11/25 0557) Last BM Date: 07/10/13   Laboratory  BMET Pending   Radiology Results Abd x-ray: Diffuse intestinal dilatation continues (Official read pending)   Physical Exam General appearance: alert and no distress Resp: clear to auscultation bilaterally Cardio: regular rate and rhythm GI: Moderate distension, diminished BS   Assessment/Plan: Ileus -- Discussed pros and cons of NGT, pt decided to replace.  Multiple rib fxs  C7/T1 fxs -- Collar  Hyponatremia -- BMET pending FEN -- Will put on tramadol to try and decrease narcotic usage as this may be contributing VTE -- SCD's, Lovenox  Dispo -- Ileus    Freeman Caldron, PA-C Pager: [phone] General Trauma PA Pager: [phone]   07/11/2013

## 2013-07-11 NOTE — Progress Notes (Signed)
UR completed.  Haneefah Venturini, RN BSN [facility] CCM Trauma/Neuro ICU Case Manager [phone]  

## 2013-07-11 NOTE — Progress Notes (Signed)
More distention and no improvement in X-rays - persistent ileus. Reinsert NGT. Na better. Patient examined and I agree with the assessment and plan  Violeta Gelinas, MD, MPH, FACS Pager: [phone]  07/11/2013 10:06 AM

## 2013-07-12 ENCOUNTER — Encounter (INDEPENDENT_AMBULATORY_CARE_PROVIDER_SITE_OTHER): Payer: Self-pay

## 2013-07-12 MED ORDER — DIPHENHYDRAMINE HCL 25 MG PO CAPS
25.0000 mg | ORAL_CAPSULE | Freq: Once | ORAL | Status: AC
  Administered 2013-07-12: 25 mg via ORAL
  Filled 2013-07-12: qty 1
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
## 2013-07-12 NOTE — Progress Notes (Signed)
I have seen and examined the patient and agree with the assessment and plans.  Drea Jurewicz A. Karinne Schmader  MD, FACS  

## 2013-07-13 LAB — BASIC METABOLIC PANEL
BUN: 12 mg/dL (ref 6–23)
Calcium: 8.8 mg/dL (ref 8.4–10.5)
Chloride: 96 mEq/L (ref 96–112)
Creatinine, Ser: 0.84 mg/dL (ref 0.50–1.35)
GFR calc Af Amer: >90 mL/min (ref >90)
GFR calc non Af Amer: >90 mL/min (ref >90)
Sodium: 132 mEq/L — ABNORMAL LOW (ref 135–145)

## 2013-07-13 MED ORDER — HYDROMORPHONE HCL PF 1 MG/ML IJ SOLN: 0.5000 mg | INTRAMUSCULAR | Status: DC | PRN

## 2013-07-13 MED ORDER — ZOLPIDEM TARTRATE 5 MG PO TABS: 5.0000 mg | ORAL_TABLET | Freq: Every evening | ORAL | Status: DC | PRN

## 2013-07-13 MED ORDER — DOCUSATE SODIUM 100 MG PO CAPS
100.0000 mg | ORAL_CAPSULE | Freq: Two times a day (BID) | ORAL | Status: DC
  Administered 2013-07-13 – 2013-07-18 (×11): 100 mg via ORAL
  Filled 2013-07-13 (×11): qty 1

## 2013-07-13 MED ORDER — LORAZEPAM 1 MG PO TABS
1.0000 mg | ORAL_TABLET | Freq: Once | ORAL | Status: AC | PRN
  Administered 2013-07-13: 1 mg via ORAL
  Filled 2013-07-13: qty 1

## 2013-07-13 MED ORDER — PROMETHAZINE HCL 25 MG PO TABS: 12.5000 mg | ORAL_TABLET | Freq: Once | ORAL | Status: AC | PRN
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
## 2013-07-14 MED ORDER — LORAZEPAM 1 MG PO TABS
1.0000 mg | ORAL_TABLET | Freq: Every evening | ORAL | Status: DC | PRN
  Administered 2013-07-15 – 2013-07-18 (×4): 1 mg via ORAL
  Filled 2013-07-14 (×4): qty 1
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
## 2013-07-15 MED ORDER — POLYETHYLENE GLYCOL 3350 17 G PO PACK
34.0000 g | PACK | Freq: Two times a day (BID) | ORAL | Status: AC
  Administered 2013-07-15 (×2): 34 g via ORAL
  Filled 2013-07-15 (×2): qty 2
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
## 2013-07-16 ENCOUNTER — Inpatient Hospital Stay (HOSPITAL_COMMUNITY): Payer: Self-pay

## 2013-07-16 LAB — BASIC METABOLIC PANEL
BUN: 5 mg/dL — ABNORMAL LOW (ref 6–23)
Calcium: 8.9 mg/dL (ref 8.4–10.5)
GFR calc Af Amer: >90 mL/min (ref >90)
GFR calc non Af Amer: 86 mL/min — ABNORMAL LOW (ref >90)
Glucose, Bld: 166 mg/dL — ABNORMAL HIGH (ref 70–99)
Sodium: 133 mEq/L — ABNORMAL LOW (ref 135–145)

## 2013-07-16 MED ORDER — POLYETHYLENE GLYCOL 3350 17 G PO PACK
34.0000 g | PACK | Freq: Two times a day (BID) | ORAL | Status: AC
  Administered 2013-07-16 (×2): 34 g via ORAL
  Filled 2013-07-16 (×2): qty 2

## 2013-07-16 MED ORDER — MILK AND MOLASSES ENEMA
Freq: Once | RECTAL | Status: AC
  Administered 2013-07-16: 13:00:00 via RECTAL
  Filled 2013-07-16: qty 250

## 2013-07-16 MED ORDER — TRAMADOL HCL 50 MG PO TABS
50.0000 mg | ORAL_TABLET | Freq: Four times a day (QID) | ORAL | Status: DC | PRN
  Administered 2013-07-16 – 2013-07-18 (×9): 50 mg via ORAL
  Filled 2013-07-16 (×9): qty 1

## 2013-07-16 MED ORDER — SORBITOL 70 % SOLN
960.0000 mL | TOPICAL_OIL | Freq: Once | ORAL | Status: DC
  Filled 2013-07-16: qty 240

## 2013-07-16 NOTE — Progress Notes (Signed)
  Subjective: Alert and stable. Tolerating diet without cramps or nausea. Passing flatus. Still feels distended. Still no bowel movement despite MiraLAX.  He is concerned about his distention and the lack of bowel movement. Neither he nor  I think it's a good idea to go home yet.  Objective: Vital signs in last 24 hours: Temp:  [97.4 F (36.3 C)-98.3 F (36.8 C)] 98.3 F (36.8 C) (11/30 0503) Pulse Rate:  [70-77] 70 (11/30 0503) Resp:  [18] 18 (11/30 0503) BP: (153-168)/(90-101) 168/101 mmHg (11/30 0503) SpO2:  [93 %-96 %] 96 % (11/30 0503) Last BM Date: 07/13/13  Intake/Output from previous day: 11/29 0701 - 11/30 0700 In: 689.7 [I.V.:689.7] Out: 1075 [Urine:1075] Intake/Output this shift:     EXAM:  General appearance: alert. Pleasant. Cooperative. Spirits better.. Normal mental status  Neck: C-collar in place.  Chest wall: no tenderness, clear to auscultation bilaterally.  No increased work of breathing. No wheeze or rhonchi.  GI: obese. Soft. distended, slightly tympanitic. Hypoactive bowel sounds. Nontender  Lab Results:  No results found for this basename: WBC, HGB, HCT, PLT,  in the last 72 hours BMET  Recent Labs  07/13/13 1130  NA 132*  K 3.9  CL 96  CO2 19  GLUCOSE 72  BUN 12  CREATININE 0.84  CALCIUM 8.8   PT/INR No results found for this basename: LABPROT, INR,  in the last 72 hours ABG No results found for this basename: PHART, PCO2, PO2, HCO3,  in the last 72 hours  Studies/Results: No results found.  Anti-infectives: Anti-infectives   None      Assessment/Plan:   Ileus -- Very slow to resolve. Advance diet as tolerated  Give double dose of MiraLAX twice today in hopes of stimulating bowel movement  Milk and molasses enema today Check be met today Check abdominal x-ray today Hopefully discharge tomorrow.  Reduce tramadol 2 when necessary only.  Insomnia. Ativan at bedtime when necessary.. Avoid narcotic. Discussed with patient.   Multiple rib fxs  Push ambulation  C7/T1 fxs -- Collar  Hyponatremia -- resolved  FEN -- Cont tramadol, PRN only, to try and decrease narcotic usage as this may be contributing to ileus  VTE -- SCD's, Lovenox  Dispo -- Ileus    LOS: 8 days    James Henson M 07/16/2013

## 2013-07-16 NOTE — Progress Notes (Signed)
Pt c/o pain mid back on the left side which was there yesterday and worsened during the night and he is describing it as "excrutiating."  Trauma dr. Luberta Robertson.

## 2013-07-17 NOTE — Progress Notes (Signed)
Trauma Service Note  Subjective: Patient doing better.  Had several bowel movements today.  Still distended.  Objective: Vital signs in last 24 hours: Temp:  [98 F (36.7 C)-98.9 F (37.2 C)] 98 F (36.7 C) (12/01 1424) Pulse Rate:  [68-81] 81 (12/01 1424) Resp:  [17-18] 17 (12/01 1424) BP: (130-141)/(79-82) 141/79 mmHg (12/01 1424) SpO2:  [95 %-98 %] 95 % (12/01 1424) Last BM Date: Aug 05, 2013  Intake/Output from previous day: Aug 06, 2023 0701 - 12/01 0700 In: 700 [P.O.:300; I.V.:400] Out: 403 [Urine:400; Stool:3] Intake/Output this shift: Total I/O In: 360 [P.O.:360] Out: 500 [Urine:500]  General: No acute distress although he is complaining of pain in his left upper back in the muscle  Lungs: Clear  Abd: Soft, distended, hypoactive bowel sounds.  + bowel movements  Extremities: No change  Neuro: Intact  Lab Results: CBC  No results found for this basename: WBC, HGB, HCT, PLT,  in the last 72 hours BMET  Recent Labs  08/05/2013 1049  NA 133*  K 3.3*  CL 98  CO2 24  GLUCOSE 166*  BUN 5*  CREATININE 0.98  CALCIUM 8.9   PT/INR No results found for this basename: LABPROT, INR,  in the last 72 hours ABG No results found for this basename: PHART, PCO2, PO2, HCO3,  in the last 72 hours  Studies/Results: Dg Abd 2 Views  08-05-2013   CLINICAL DATA:  Ileus.  EXAM: ABDOMEN - 2 VIEW  COMPARISON:  07/11/2013.  FINDINGS: Air-filled loops of small large bowel are noted. These are moderately distended. Degree of distention has decreased from prior study of 07/11/2013. These findings are consistent with improving adynamic ileus. No free air noted. No pathological abdominal calcifications. Degenerative changes noted lumbar spine and both hips. Atelectatic changes left lung base. Left pleural effusion.  IMPRESSION: 1. Interim improvement of bowel distention suggesting improving adynamic ileus.  2.  Left lung base atelectasis and left pleural effusion.   Electronically Signed   By:  Maisie Fus  Register   On: 08/05/2013 09:48    Anti-infectives: Anti-infectives   None      Assessment/Plan: s/p  Although pain may be improved with muscle relaxant, do not want to risk with colonic ileus. Keep using heating pad.  LOS: 9 days   Marta Lamas. Gae Bon, MD, FACS [phone] Trauma Surgeon 07/17/2013

## 2013-07-18 MED ORDER — POTASSIUM CHLORIDE CRYS ER 20 MEQ PO TBCR: 40.0000 meq | EXTENDED_RELEASE_TABLET | Freq: Two times a day (BID) | ORAL | Status: DC

## 2013-07-18 MED ORDER — ACETAMINOPHEN 325 MG PO TABS: 325.0000 mg | ORAL_TABLET | Freq: Four times a day (QID) | ORAL | Status: DC | PRN

## 2013-07-18 MED ORDER — TRAMADOL HCL 50 MG PO TABS: 25.0000 mg | ORAL_TABLET | Freq: Four times a day (QID) | ORAL | Status: DC | PRN

## 2013-07-18 MED ORDER — DSS 100 MG PO CAPS: 100.0000 mg | ORAL_CAPSULE | Freq: Two times a day (BID) | ORAL | Status: DC
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
## 2013-07-18 NOTE — Progress Notes (Signed)
  Subjective: Pt doing well. Pt with BMs x 2 yesterday, but not recorded. tol PO  Objective: Vital signs in last 24 hours: Temp:  [97.7 F (36.5 C)-98 F (36.7 C)] 97.7 F (36.5 C) (12/02 0455) Pulse Rate:  [78-81] 78 (12/02 0455) Resp:  [17-18] 18 (12/02 0455) BP: (141-159)/(79-91) 155/91 mmHg (12/02 0455) SpO2:  [95 %-100 %] 100 % (12/02 0455) Last BM Date: 07/17/13  Intake/Output from previous day: 12/01 0701 - 12/02 0700 In: 1627.5 [P.O.:460; I.V.:1167.5] Out: 1350 [Urine:1350] Intake/Output this shift: Total I/O In: -  Out: 300 [Urine:300]  General appearance: alert and cooperative Resp: clear to auscultation bilaterally Cardio: regular rate and rhythm, S1, S2 normal, no murmur, click, rub or gallop GI: soft, min dist, hypoactive BS, no rebound, guarding  No labs today  Assessment/Plan: Ileus  Hypokalemia - replace today, cont' with normal diet Con't to ambulate well Con't heating pad for scapula pain, avoid narcotics    LOS: 10 days    Marigene Ehlers., [facility] 07/18/2013

## 2013-07-18 NOTE — Progress Notes (Signed)
Chaplain provided ministry of presence and pastoral support for the patient. 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   07/18/13 1400  Clinical Encounter Type  Visited With Patient  Visit Type Initial;Spiritual support;Social support

## 2013-07-20 ENCOUNTER — Encounter (INDEPENDENT_AMBULATORY_CARE_PROVIDER_SITE_OTHER): Payer: Self-pay | Admitting: Surgery

## 2013-07-20 ENCOUNTER — Ambulatory Visit (INDEPENDENT_AMBULATORY_CARE_PROVIDER_SITE_OTHER): Payer: Self-pay | Admitting: Surgery

## 2013-07-20 ENCOUNTER — Other Ambulatory Visit (INDEPENDENT_AMBULATORY_CARE_PROVIDER_SITE_OTHER): Payer: Self-pay

## 2013-07-20 VITALS — BP 158/102 | HR 80 | Temp 97.1°F | Resp 20 | Ht 73.0 in | Wt 305.4 lb

## 2013-07-20 DIAGNOSIS — IMO0001 Reserved for inherently not codable concepts without codable children: Secondary | ICD-10-CM

## 2013-07-20 DIAGNOSIS — S129XXD Fracture of neck, unspecified, subsequent encounter: Secondary | ICD-10-CM

## 2013-07-20 DIAGNOSIS — S2242XD Multiple fractures of ribs, left side, subsequent encounter for fracture with routine healing: Secondary | ICD-10-CM

## 2013-07-20 DIAGNOSIS — R339 Retention of urine, unspecified: Secondary | ICD-10-CM

## 2013-07-20 DIAGNOSIS — S2242XS Multiple fractures of ribs, left side, sequela: Secondary | ICD-10-CM

## 2013-07-20 DIAGNOSIS — IMO0002 Reserved for concepts with insufficient information to code with codable children: Secondary | ICD-10-CM
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
## 2013-07-21 ENCOUNTER — Ambulatory Visit
Admission: RE | Admit: 2013-07-21 | Discharge: 2013-07-21 | Disposition: A | Discharge status: Home / Self Care | Payer: No Typology Code available for payment source | Source: Ambulatory Visit | Attending: Surgery | Admitting: Surgery

## 2013-07-25 ENCOUNTER — Telehealth (HOSPITAL_COMMUNITY): Payer: Self-pay | Admitting: Emergency Medicine

## 2013-07-26 ENCOUNTER — Encounter (INDEPENDENT_AMBULATORY_CARE_PROVIDER_SITE_OTHER): Payer: Self-pay

## 2013-07-26 ENCOUNTER — Ambulatory Visit (INDEPENDENT_AMBULATORY_CARE_PROVIDER_SITE_OTHER): Payer: Self-pay | Admitting: General Surgery

## 2013-07-26 VITALS — BP 140/89 | HR 78 | Temp 98.6°F | Resp 14 | Ht 73.0 in | Wt 294.2 lb

## 2013-07-26 DIAGNOSIS — J9383 Other pneumothorax: Secondary | ICD-10-CM

## 2013-07-26 DIAGNOSIS — S2242XD Multiple fractures of ribs, left side, subsequent encounter for fracture with routine healing: Secondary | ICD-10-CM

## 2013-07-26 DIAGNOSIS — K567 Ileus, unspecified: Secondary | ICD-10-CM

## 2013-07-26 DIAGNOSIS — S129XXD Fracture of neck, unspecified, subsequent encounter: Secondary | ICD-10-CM

## 2013-07-26 DIAGNOSIS — J939 Pneumothorax, unspecified: Secondary | ICD-10-CM

## 2013-07-26 DIAGNOSIS — K560 Paralytic ileus: Secondary | ICD-10-CM

## 2013-07-26 DIAGNOSIS — R350 Frequency of micturition: Secondary | ICD-10-CM

## 2013-07-26 MED ORDER — METHOCARBAMOL 500 MG PO TABS: 750.0000 mg | ORAL_TABLET | Freq: Four times a day (QID) | ORAL | Status: DC | PRN

## 2013-07-26 NOTE — Patient Instructions (Signed)
Continue ice/heat therapy.  See your primary care provider regarding your sleeplessness. Try Robaxin to relax your muscles.  Follow up with [place] as needed.
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
## 2013-08-21 ENCOUNTER — Ambulatory Visit: Payer: No Typology Code available for payment source | Attending: Internal Medicine | Admitting: Physical Therapy

## 2013-08-21 DIAGNOSIS — IMO0001 Reserved for inherently not codable concepts without codable children: Secondary | ICD-10-CM | POA: Diagnosis present

## 2013-08-21 DIAGNOSIS — I10 Essential (primary) hypertension: Secondary | ICD-10-CM | POA: Insufficient documentation

## 2013-08-21 DIAGNOSIS — M25519 Pain in unspecified shoulder: Secondary | ICD-10-CM | POA: Insufficient documentation

## 2013-08-21 DIAGNOSIS — M542 Cervicalgia: Secondary | ICD-10-CM | POA: Insufficient documentation

## 2013-08-21 DIAGNOSIS — E669 Obesity, unspecified: Secondary | ICD-10-CM | POA: Insufficient documentation

## 2013-08-21 DIAGNOSIS — E039 Hypothyroidism, unspecified: Secondary | ICD-10-CM | POA: Diagnosis not present

## 2013-08-21 DIAGNOSIS — R079 Chest pain, unspecified: Secondary | ICD-10-CM | POA: Insufficient documentation

## 2013-08-21 DIAGNOSIS — R293 Abnormal posture: Secondary | ICD-10-CM | POA: Insufficient documentation

## 2013-08-23 ENCOUNTER — Ambulatory Visit: Payer: No Typology Code available for payment source | Admitting: Rehabilitation

## 2013-08-23 DIAGNOSIS — IMO0001 Reserved for inherently not codable concepts without codable children: Secondary | ICD-10-CM | POA: Diagnosis not present

## 2013-08-28 ENCOUNTER — Ambulatory Visit: Payer: No Typology Code available for payment source | Admitting: Rehabilitation

## 2013-08-28 DIAGNOSIS — IMO0001 Reserved for inherently not codable concepts without codable children: Secondary | ICD-10-CM | POA: Diagnosis not present

## 2013-08-30 ENCOUNTER — Ambulatory Visit: Payer: No Typology Code available for payment source | Admitting: Rehabilitation

## 2013-08-30 DIAGNOSIS — IMO0001 Reserved for inherently not codable concepts without codable children: Secondary | ICD-10-CM | POA: Diagnosis not present

## 2013-09-04 ENCOUNTER — Ambulatory Visit: Payer: No Typology Code available for payment source | Admitting: Rehabilitation

## 2013-09-04 DIAGNOSIS — IMO0001 Reserved for inherently not codable concepts without codable children: Secondary | ICD-10-CM | POA: Diagnosis not present

## 2013-09-06 ENCOUNTER — Ambulatory Visit: Payer: No Typology Code available for payment source | Admitting: Rehabilitation

## 2013-09-06 DIAGNOSIS — IMO0001 Reserved for inherently not codable concepts without codable children: Secondary | ICD-10-CM | POA: Diagnosis not present

## 2013-09-11 ENCOUNTER — Ambulatory Visit: Payer: No Typology Code available for payment source | Admitting: Rehabilitation

## 2013-09-11 DIAGNOSIS — IMO0001 Reserved for inherently not codable concepts without codable children: Secondary | ICD-10-CM | POA: Diagnosis not present

## 2013-09-13 ENCOUNTER — Ambulatory Visit: Payer: No Typology Code available for payment source | Admitting: Rehabilitation

## 2013-09-25 ENCOUNTER — Ambulatory Visit: Payer: No Typology Code available for payment source | Attending: Internal Medicine | Admitting: Rehabilitation

## 2013-09-25 DIAGNOSIS — R079 Chest pain, unspecified: Secondary | ICD-10-CM | POA: Insufficient documentation

## 2013-09-25 DIAGNOSIS — I10 Essential (primary) hypertension: Secondary | ICD-10-CM | POA: Insufficient documentation

## 2013-09-25 DIAGNOSIS — IMO0001 Reserved for inherently not codable concepts without codable children: Secondary | ICD-10-CM | POA: Insufficient documentation

## 2013-09-25 DIAGNOSIS — R293 Abnormal posture: Secondary | ICD-10-CM | POA: Insufficient documentation

## 2013-09-25 DIAGNOSIS — M25519 Pain in unspecified shoulder: Secondary | ICD-10-CM | POA: Insufficient documentation

## 2013-09-25 DIAGNOSIS — E039 Hypothyroidism, unspecified: Secondary | ICD-10-CM | POA: Insufficient documentation

## 2013-09-25 DIAGNOSIS — M542 Cervicalgia: Secondary | ICD-10-CM | POA: Insufficient documentation

## 2013-09-25 DIAGNOSIS — E669 Obesity, unspecified: Secondary | ICD-10-CM | POA: Insufficient documentation

## 2013-09-27 ENCOUNTER — Ambulatory Visit: Payer: No Typology Code available for payment source | Admitting: Rehabilitation
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
## 2015-02-08 IMAGING — CR DG CHEST 1V PORT
1 series · 1 of 1 positions shown · non-contrast
Comparison: 07/01/2013

CLINICAL DATA: Followup pneumothorax.

EXAM:
PORTABLE CHEST - 1 VIEW

[AP]
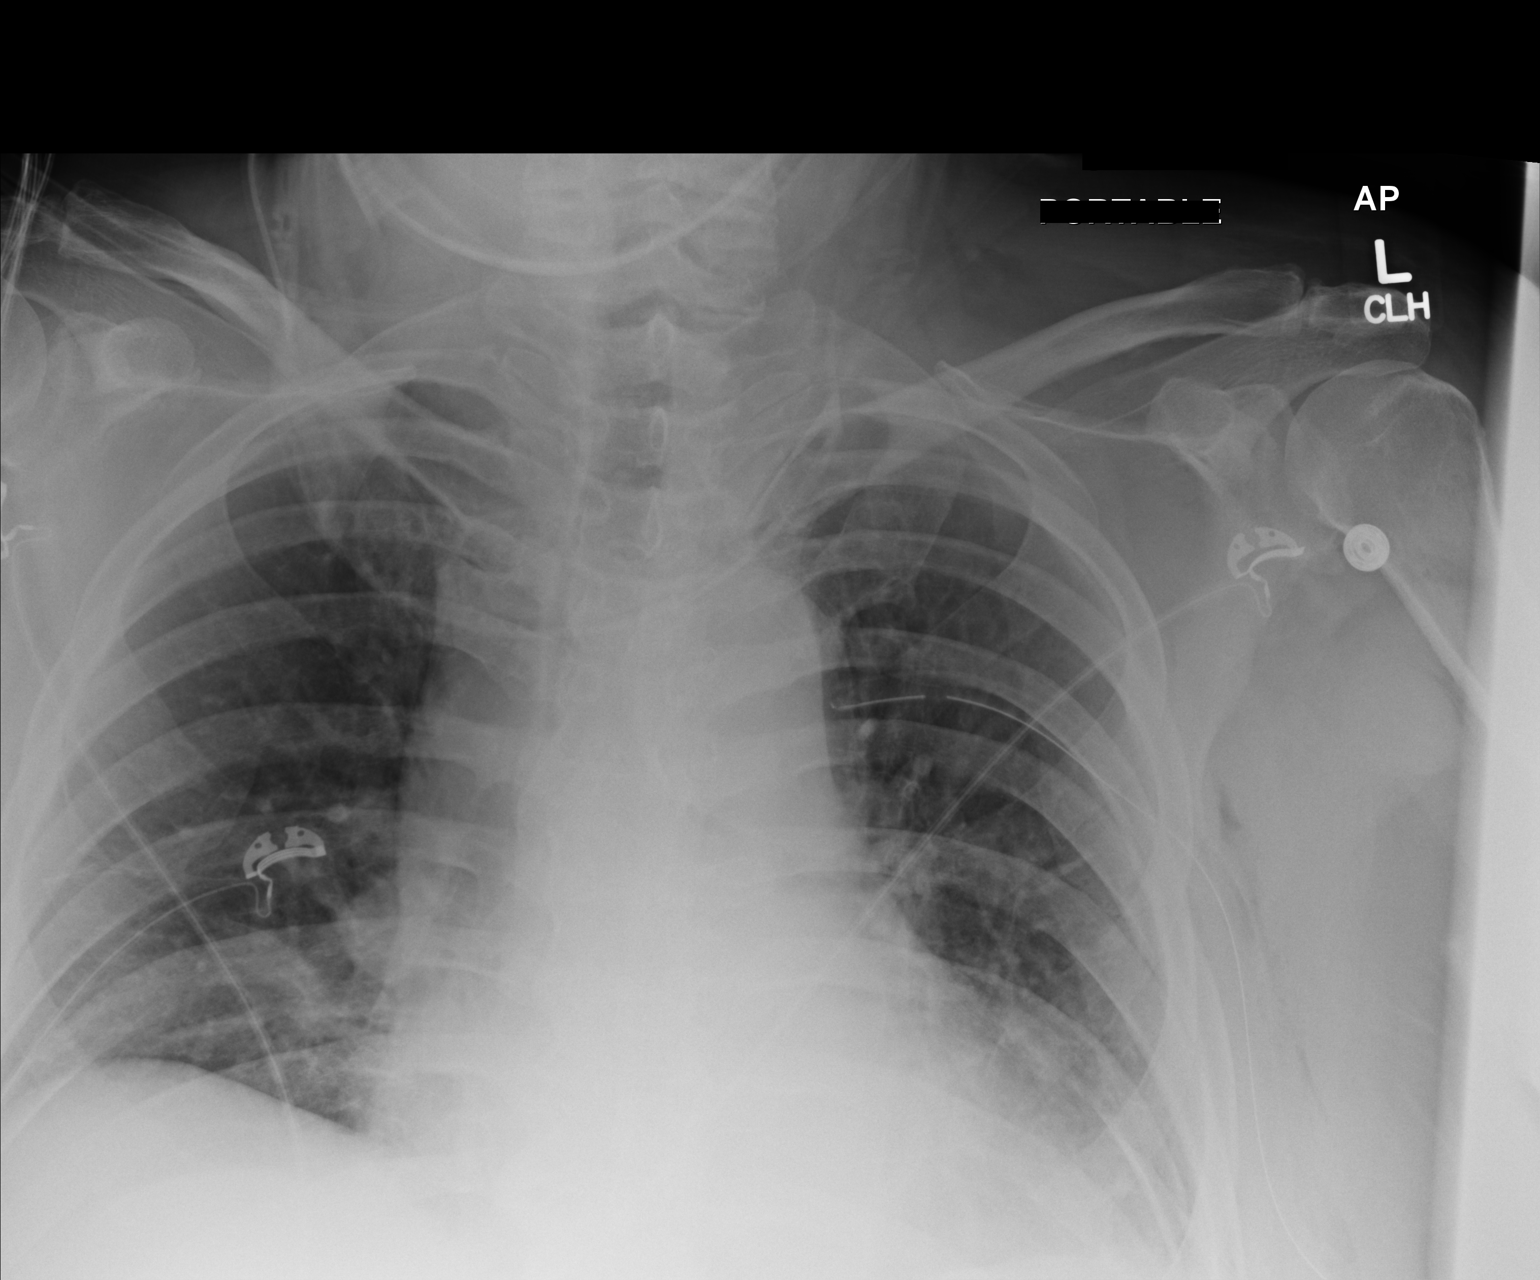

[1 of 1 positions shown; findings below may reference images not displayed]

FINDINGS: Left chest tube is stable. Small amount of subcutaneous emphysema
lies adjacent to the chest tube along the left lateral chest wall,
stable.

No pneumothorax.

Left lung base and milder medial right lung base opacity is noted
likely atelectasis. No edema.
IMPRESSION: 1. No significant change from the previous day's study.
2. Stable left chest tube.  No pneumothorax.

## 2015-05-16 ENCOUNTER — Other Ambulatory Visit: Payer: Self-pay | Admitting: Neurology

## 2015-05-16 MED ORDER — MOMETASONE FURO-FORMOTEROL FUM 200-5 MCG/ACT IN AERO: 2.0000 | INHALATION_SPRAY | Freq: Two times a day (BID) | RESPIRATORY_TRACT | Status: DC

## 2015-05-29 ENCOUNTER — Telehealth: Payer: Self-pay | Admitting: Allergy and Immunology

## 2015-05-29 MED ORDER — MOMETASONE FURO-FORMOTEROL FUM 200-5 MCG/ACT IN AERO: 2.0000 | INHALATION_SPRAY | Freq: Two times a day (BID) | RESPIRATORY_TRACT | Status: DC

## 2015-05-29 NOTE — Telephone Encounter (Addendum)
Left voicemail for patient. Informed them there is two samples of Dulera 200 at front desk for patient to pick up, which hopefully will last until PA gets through.  Received PA fax from pharmacy today. Will put into CoverMyMeds online to complete PA, and await their response.

## 2015-05-29 NOTE — Telephone Encounter (Signed)
Pt is calling about [employer].  His insurance wouldn't cover Ruthe Mannan last yr and we talked with ins. To get coverage for [facility]. The year is up now for the [facility]. Pt is wondering if we can speak with ins. Co. Again to allow for [facility] coverage for this year. Pt. States this is the only inhaler that works for him pls advise

## 2015-05-30 NOTE — Telephone Encounter (Signed)
Completed PA online. Awaiting their response

## 2015-06-05 ENCOUNTER — Telehealth: Payer: Self-pay | Admitting: Allergy and Immunology

## 2015-06-05 NOTE — Telephone Encounter (Signed)
Spoke with pharmacy. We received a fax stating the PA for Dulera 200 had been approved. Asked the pharmacist to run the medicine, and it went through. Pharmacist stated that she would have to order more Dulera, and it would be there by tomorrow morning.

## 2015-09-16 ENCOUNTER — Other Ambulatory Visit: Payer: Self-pay | Admitting: Neurology

## 2015-09-16 DIAGNOSIS — G4733 Obstructive sleep apnea (adult) (pediatric): Secondary | ICD-10-CM | POA: Diagnosis not present

## 2015-09-16 MED ORDER — OMEPRAZOLE 20 MG PO CPDR: 20.0000 mg | DELAYED_RELEASE_CAPSULE | Freq: Every day | ORAL | Status: DC

## 2015-09-24 DIAGNOSIS — J329 Chronic sinusitis, unspecified: Secondary | ICD-10-CM | POA: Diagnosis not present

## 2015-09-24 DIAGNOSIS — Z23 Encounter for immunization: Secondary | ICD-10-CM | POA: Diagnosis not present

## 2015-09-24 DIAGNOSIS — I10 Essential (primary) hypertension: Secondary | ICD-10-CM | POA: Diagnosis not present

## 2015-09-30 ENCOUNTER — Telehealth: Payer: Self-pay

## 2015-09-30 NOTE — Telephone Encounter (Signed)
Patient is calling to see if we have any samples of Qnasal and Dulera 200.  Please Advise  Patient stated if he did not answer to please LVM

## 2015-09-30 NOTE — Telephone Encounter (Signed)
Samples left at the front desk and called and left voicemail for patient to return phone call.

## 2015-09-30 NOTE — Telephone Encounter (Signed)
Patient notified

## 2015-10-14 ENCOUNTER — Other Ambulatory Visit: Payer: Self-pay | Admitting: Neurology

## 2015-10-14 MED ORDER — BECLOMETHASONE DIPROPIONATE 80 MCG/ACT NA AERS: 1.0000 | INHALATION_SPRAY | Freq: Two times a day (BID) | NASAL | Status: DC

## 2015-10-15 DIAGNOSIS — G4733 Obstructive sleep apnea (adult) (pediatric): Secondary | ICD-10-CM | POA: Diagnosis not present

## 2015-10-30 ENCOUNTER — Ambulatory Visit: Payer: No Typology Code available for payment source | Admitting: Allergy and Immunology

## 2015-10-30 ENCOUNTER — Encounter: Payer: Self-pay | Admitting: Allergy and Immunology

## 2015-10-30 ENCOUNTER — Ambulatory Visit (INDEPENDENT_AMBULATORY_CARE_PROVIDER_SITE_OTHER): Payer: PPO | Admitting: Allergy and Immunology

## 2015-10-30 VITALS — BP 140/80 | HR 76 | Temp 98.5°F | Resp 20

## 2015-10-30 DIAGNOSIS — K219 Gastro-esophageal reflux disease without esophagitis: Secondary | ICD-10-CM

## 2015-10-30 DIAGNOSIS — J310 Chronic rhinitis: Secondary | ICD-10-CM | POA: Insufficient documentation

## 2015-10-30 DIAGNOSIS — J45901 Unspecified asthma with (acute) exacerbation: Secondary | ICD-10-CM

## 2015-10-30 DIAGNOSIS — J4541 Moderate persistent asthma with (acute) exacerbation: Secondary | ICD-10-CM | POA: Insufficient documentation

## 2015-10-30 MED ORDER — PREDNISONE 1 MG PO TABS: 10.0000 mg | ORAL_TABLET | ORAL | Status: DC

## 2015-10-30 MED ORDER — RANITIDINE HCL 300 MG PO CAPS: 300.0000 mg | ORAL_CAPSULE | Freq: Every evening | ORAL | Status: DC

## 2015-10-30 MED ORDER — AZELASTINE HCL 0.1 % NA SOLN: NASAL | Status: DC

## 2015-10-30 MED ORDER — METHYLPREDNISOLONE ACETATE 80 MG/ML IJ SUSP
80.0000 mg | Freq: Once | INTRAMUSCULAR | Status: AC
  Administered 2015-10-30: 80 mg via INTRAMUSCULAR

## 2015-10-30 NOTE — Patient Instructions (Addendum)
Asthma with acute exacerbation  Depo-Medrol 80 mg was administered in the office.  Prednisone has been provided and is to be started tomorrow as follows: 20 mg daily x 4 days, 10 mg x1 day, then stop.  Continue Dulera 200/5 g, 2 inhalations twice a day, montelukast 10 mg daily at bedtime, and albuterol HFA every 4-6 hours as needed.  To maximize pulmonary deposition, a spacer has been provided along with instructions for its proper administration with an HFA inhaler.  The patient has been asked to contact me if his symptoms persist or progress. Otherwise, he may return for follow up in 4 months.  Chronic rhinitis  Continue Qnasl 80 g, one actuation per nostril twice a day.  A prescription has been provided for azelastine nasal spray, 1-2 sprays per nostril 2 times daily as needed. Proper nasal spray technique has been discussed and demonstrated.   Nasal saline lavage (NeilMed) as needed has been recommended along with instructions for proper administration.  Guaifenesin 1200 mg twice daily as needed with adequate hydration as discussed.   GERD (gastroesophageal reflux disease)  Appropriate list modifications have been reviewed and provided in written form.  Continue Zegerid each morning.  Add ranitidine 300 mg daily at bedtime.    Return in about 4 months (around 02/29/2016), or if symptoms worsen or fail to improve.  Lifestyle Changes for Controlling GERD  When you have GERD, stomach acid feels as if it's backing up toward your mouth. Whether or not you take medication to control your GERD, your symptoms can often be improved with lifestyle changes.   Raise Your Head  Reflux is more likely to strike when you're lying down flat, because stomach fluid can  flow backward more easily. Raising the head of your bed 4-6 inches can help. To do this:  Slide blocks or books under the legs at the head of your bed. Or, place a wedge under  the mattress. Many foam stores can make  a suitable wedge for you. The wedge  should run from your waist to the top of your head.  Don't just prop your head on several pillows. This increases pressure on your  stomach. It can make GERD worse.  Watch Your Eating Habits Certain foods may increase the acid in your stomach or relax the lower esophageal sphincter, making GERD more likely. It's best to avoid the following:  Coffee, tea, and carbonated drinks (with and without caffeine)  Fatty, fried, or spicy food  Mint, chocolate, onions, and tomatoes  Any other foods that seem to irritate your stomach or cause you pain  Relieve the Pressure  Eat smaller meals, even if you have to eat more often.  Don't lie down right after you eat. Wait a few hours for your stomach to empty.  Avoid tight belts and tight-fitting clothes.  Lose excess weight.  Tobacco and Alcohol  Avoid smoking tobacco and drinking alcohol. They can make GERD symptoms worse.

## 2015-10-30 NOTE — Assessment & Plan Note (Signed)
   Appropriate list modifications have been reviewed and provided in written form.  Continue Zegerid each morning.  Add ranitidine 300 mg daily at bedtime.

## 2015-10-30 NOTE — Assessment & Plan Note (Signed)
   Continue Qnasl 80 g, one actuation per nostril twice a day.  A prescription has been provided for azelastine nasal spray, 1-2 sprays per nostril 2 times daily as needed. Proper nasal spray technique has been discussed and demonstrated.   Nasal saline lavage (NeilMed) as needed has been recommended along with instructions for proper administration.  Guaifenesin 1200 mg twice daily as needed with adequate hydration as discussed.
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
## 2015-10-30 NOTE — Assessment & Plan Note (Signed)
   Depo-Medrol 80 mg was administered in the office.  Prednisone has been provided and is to be started tomorrow as follows: 20 mg daily x 4 days, 10 mg x1 day, then stop.  Continue Dulera 200/5 g, 2 inhalations twice a day, montelukast 10 mg daily at bedtime, and albuterol HFA every 4-6 hours as needed.  To maximize pulmonary deposition, a spacer has been provided along with instructions for its proper administration with an HFA inhaler.  The patient has been asked to contact me if his symptoms persist or progress. Otherwise, he may return for follow up in 4 months.

## 2015-11-05 ENCOUNTER — Ambulatory Visit: Payer: No Typology Code available for payment source | Admitting: Allergy and Immunology

## 2015-11-14 DIAGNOSIS — G4733 Obstructive sleep apnea (adult) (pediatric): Secondary | ICD-10-CM | POA: Diagnosis not present

## 2015-11-29 NOTE — Addendum Note (Signed)
Addended by: Angelica Ran on: 11/29/2015 12:35 PM   Modules accepted: Orders

## 2015-12-02 ENCOUNTER — Other Ambulatory Visit: Payer: Self-pay | Admitting: *Deleted

## 2015-12-02 MED ORDER — MOMETASONE FURO-FORMOTEROL FUM 200-5 MCG/ACT IN AERO: 2.0000 | INHALATION_SPRAY | Freq: Two times a day (BID) | RESPIRATORY_TRACT | Status: DC

## 2015-12-10 DIAGNOSIS — J45909 Unspecified asthma, uncomplicated: Secondary | ICD-10-CM | POA: Diagnosis not present

## 2015-12-10 DIAGNOSIS — E669 Obesity, unspecified: Secondary | ICD-10-CM | POA: Diagnosis not present

## 2015-12-10 DIAGNOSIS — G622 Polyneuropathy due to other toxic agents: Secondary | ICD-10-CM | POA: Diagnosis not present

## 2015-12-10 DIAGNOSIS — R7301 Impaired fasting glucose: Secondary | ICD-10-CM | POA: Diagnosis not present

## 2015-12-10 DIAGNOSIS — E039 Hypothyroidism, unspecified: Secondary | ICD-10-CM | POA: Diagnosis not present

## 2015-12-10 DIAGNOSIS — Z6838 Body mass index (BMI) 38.0-38.9, adult: Secondary | ICD-10-CM | POA: Diagnosis not present

## 2015-12-10 DIAGNOSIS — N401 Enlarged prostate with lower urinary tract symptoms: Secondary | ICD-10-CM | POA: Diagnosis not present

## 2015-12-10 DIAGNOSIS — Z0000 Encounter for general adult medical examination without abnormal findings: Secondary | ICD-10-CM | POA: Diagnosis not present

## 2015-12-10 DIAGNOSIS — G4733 Obstructive sleep apnea (adult) (pediatric): Secondary | ICD-10-CM | POA: Diagnosis not present

## 2015-12-10 DIAGNOSIS — Z1389 Encounter for screening for other disorder: Secondary | ICD-10-CM | POA: Diagnosis not present

## 2015-12-10 DIAGNOSIS — E7800 Pure hypercholesterolemia, unspecified: Secondary | ICD-10-CM | POA: Diagnosis not present

## 2015-12-10 DIAGNOSIS — I10 Essential (primary) hypertension: Secondary | ICD-10-CM | POA: Diagnosis not present

## 2015-12-15 DIAGNOSIS — G4733 Obstructive sleep apnea (adult) (pediatric): Secondary | ICD-10-CM | POA: Diagnosis not present

## 2016-01-09 DIAGNOSIS — M170 Bilateral primary osteoarthritis of knee: Secondary | ICD-10-CM | POA: Diagnosis not present

## 2016-01-09 DIAGNOSIS — M25561 Pain in right knee: Secondary | ICD-10-CM | POA: Diagnosis not present

## 2016-01-09 DIAGNOSIS — R262 Difficulty in walking, not elsewhere classified: Secondary | ICD-10-CM | POA: Diagnosis not present

## 2016-01-09 DIAGNOSIS — M1711 Unilateral primary osteoarthritis, right knee: Secondary | ICD-10-CM | POA: Diagnosis not present

## 2016-01-14 ENCOUNTER — Telehealth: Payer: Self-pay | Admitting: Allergy and Immunology

## 2016-01-14 ENCOUNTER — Other Ambulatory Visit: Payer: Self-pay | Admitting: *Deleted

## 2016-01-14 DIAGNOSIS — G4733 Obstructive sleep apnea (adult) (pediatric): Secondary | ICD-10-CM | POA: Diagnosis not present

## 2016-01-14 MED ORDER — LOSARTAN POTASSIUM 50 MG PO TABS: 50.0000 mg | ORAL_TABLET | Freq: Every day | ORAL | Status: DC

## 2016-01-14 NOTE — Telephone Encounter (Signed)
Have placed samples up front, with a dulera coupon

## 2016-01-14 NOTE — Telephone Encounter (Signed)
He went to the pharmacy to pick up his [facility]. The price had greatly increased and he wants to know if he can get samples of this and can you help him to get this at a cheaper price. He would also like to have samples of Q-Nasal.

## 2016-01-16 ENCOUNTER — Other Ambulatory Visit: Payer: Self-pay | Admitting: *Deleted

## 2016-01-16 DIAGNOSIS — M25562 Pain in left knee: Secondary | ICD-10-CM | POA: Diagnosis not present

## 2016-01-16 DIAGNOSIS — M25561 Pain in right knee: Secondary | ICD-10-CM | POA: Diagnosis not present

## 2016-01-16 DIAGNOSIS — M170 Bilateral primary osteoarthritis of knee: Secondary | ICD-10-CM | POA: Diagnosis not present

## 2016-01-16 MED ORDER — FLUTICASONE FUROATE-VILANTEROL 200-25 MCG/INH IN AEPB: 1.0000 | INHALATION_SPRAY | Freq: Every day | RESPIRATORY_TRACT | Status: DC

## 2016-01-20 DIAGNOSIS — M25561 Pain in right knee: Secondary | ICD-10-CM | POA: Diagnosis not present

## 2016-01-20 DIAGNOSIS — M1711 Unilateral primary osteoarthritis, right knee: Secondary | ICD-10-CM | POA: Diagnosis not present

## 2016-01-22 DIAGNOSIS — M170 Bilateral primary osteoarthritis of knee: Secondary | ICD-10-CM | POA: Diagnosis not present

## 2016-01-22 DIAGNOSIS — M25562 Pain in left knee: Secondary | ICD-10-CM | POA: Diagnosis not present

## 2016-01-22 DIAGNOSIS — M25561 Pain in right knee: Secondary | ICD-10-CM | POA: Diagnosis not present

## 2016-01-27 DIAGNOSIS — M170 Bilateral primary osteoarthritis of knee: Secondary | ICD-10-CM | POA: Diagnosis not present

## 2016-01-27 DIAGNOSIS — M25561 Pain in right knee: Secondary | ICD-10-CM | POA: Diagnosis not present

## 2016-01-27 DIAGNOSIS — M25562 Pain in left knee: Secondary | ICD-10-CM | POA: Diagnosis not present

## 2016-01-29 DIAGNOSIS — M170 Bilateral primary osteoarthritis of knee: Secondary | ICD-10-CM | POA: Diagnosis not present

## 2016-01-29 DIAGNOSIS — M25561 Pain in right knee: Secondary | ICD-10-CM | POA: Diagnosis not present

## 2016-01-29 DIAGNOSIS — R262 Difficulty in walking, not elsewhere classified: Secondary | ICD-10-CM | POA: Diagnosis not present

## 2016-01-29 DIAGNOSIS — M1711 Unilateral primary osteoarthritis, right knee: Secondary | ICD-10-CM | POA: Diagnosis not present

## 2016-01-29 DIAGNOSIS — M25562 Pain in left knee: Secondary | ICD-10-CM | POA: Diagnosis not present

## 2016-01-30 DIAGNOSIS — M25561 Pain in right knee: Secondary | ICD-10-CM | POA: Diagnosis not present

## 2016-01-30 DIAGNOSIS — G8929 Other chronic pain: Secondary | ICD-10-CM | POA: Diagnosis not present

## 2016-02-03 DIAGNOSIS — M1711 Unilateral primary osteoarthritis, right knee: Secondary | ICD-10-CM | POA: Diagnosis not present

## 2016-02-03 DIAGNOSIS — M25561 Pain in right knee: Secondary | ICD-10-CM | POA: Diagnosis not present

## 2016-02-05 DIAGNOSIS — M1712 Unilateral primary osteoarthritis, left knee: Secondary | ICD-10-CM | POA: Diagnosis not present

## 2016-02-05 DIAGNOSIS — M25562 Pain in left knee: Secondary | ICD-10-CM | POA: Diagnosis not present

## 2016-02-10 DIAGNOSIS — M25561 Pain in right knee: Secondary | ICD-10-CM | POA: Diagnosis not present

## 2016-02-10 DIAGNOSIS — M170 Bilateral primary osteoarthritis of knee: Secondary | ICD-10-CM | POA: Diagnosis not present

## 2016-02-10 DIAGNOSIS — M25562 Pain in left knee: Secondary | ICD-10-CM | POA: Diagnosis not present

## 2016-02-14 DIAGNOSIS — G4733 Obstructive sleep apnea (adult) (pediatric): Secondary | ICD-10-CM | POA: Diagnosis not present

## 2016-02-24 DIAGNOSIS — M1711 Unilateral primary osteoarthritis, right knee: Secondary | ICD-10-CM | POA: Diagnosis not present

## 2016-03-03 ENCOUNTER — Ambulatory Visit: Payer: PPO | Admitting: Allergy and Immunology

## 2016-03-15 DIAGNOSIS — G4733 Obstructive sleep apnea (adult) (pediatric): Secondary | ICD-10-CM | POA: Diagnosis not present

## 2016-03-19 ENCOUNTER — Other Ambulatory Visit: Payer: Self-pay | Admitting: Orthopaedic Surgery

## 2016-04-08 ENCOUNTER — Encounter (HOSPITAL_COMMUNITY): Payer: Self-pay

## 2016-04-08 NOTE — Pre-Procedure Instructions (Signed)
Adonijah Kubly Giarrusso  04/08/2016      [id number] [place], [place] - [address] [place] [address] Phone: [phone] Fax: [phone]    Your procedure is scheduled on September 5  Report to [facility] Admitting at 0800 A.M.  Call this number if you have problems the morning of surgery:  [phone]   Remember:  Do not eat food or drink liquids after midnight.   Take these medicines the morning of surgery with A SIP OF WATER acetaminophen (tylenol) if needed, nasal spray, fexofenadine (allegra), fluticasone furoate-vilanterol (breo ellipta), gabapentin (neurontin), levothroxine (synthroid), mometasone-formoterol (dulera), montelukast (singulair), omeprazole (prilosec),   7 days prior to surgery STOP taking any Aspirin, Aleve, Naproxen, Ibuprofen, Motrin, Advil, Goody's, BC's, all herbal medications, fish oil, and all vitamins    Do not wear jewelry.  Do not wear lotions, powders, or colonge, or deoderant.   Men may shave face and neck.  Do not bring valuables to the hospital.  [facility] is not responsible for any belongings or valuables.  Contacts, dentures or bridgework may not be worn into surgery.  Leave your suitcase in the car.  After surgery it may be brought to your room.  For patients admitted to the hospital, discharge time will be determined by your treatment team.  Patients discharged the day of surgery will not be allowed to drive home.    Special instructions:   [place]- Preparing For Surgery  Before surgery, you can play an important role. Because skin is not sterile, your skin needs to be as free of germs as possible. You can reduce the number of germs on your skin by washing with CHG (chlorahexidine gluconate) Soap before surgery.  CHG is an antiseptic cleaner which kills germs and bonds with the skin to continue killing germs even after washing.  Please do not use if you have  an allergy to CHG or antibacterial soaps. If your skin becomes reddened/irritated stop using the CHG.  Do not shave (including legs and underarms) for at least 48 hours prior to first CHG shower. It is OK to shave your face.  Please follow these instructions carefully.   1. Shower the NIGHT BEFORE SURGERY and the MORNING OF SURGERY with CHG.   2. If you chose to wash your hair, wash your hair first as usual with your normal shampoo.  3. After you shampoo, rinse your hair and body thoroughly to remove the shampoo.  4. Use CHG as you would any other liquid soap. You can apply CHG directly to the skin and wash gently with a scrungie or a clean washcloth.   5. Apply the CHG Soap to your body ONLY FROM THE NECK DOWN.  Do not use on open wounds or open sores. Avoid contact with your eyes, ears, mouth and genitals (private parts). Wash genitals (private parts) with your normal soap.  6. Wash thoroughly, paying special attention to the area where your surgery will be performed.  7. Thoroughly rinse your body with warm water from the neck down.  8. DO NOT shower/wash with your normal soap after using and rinsing off the CHG Soap.  9. Pat yourself dry with a CLEAN TOWEL.   10. Wear CLEAN PAJAMAS   11. Place CLEAN SHEETS on your bed the night of your first shower and DO NOT SLEEP WITH PETS.    Day of Surgery: Do not apply any deodorants/lotions. Please wear clean  clothes to the hospital/surgery center.      Please read over the following fact sheets that you were given. Pain Booklet, Coughing and Deep Breathing, MRSA Information and Surgical Site Infection Prevention

## 2016-04-09 ENCOUNTER — Encounter (HOSPITAL_COMMUNITY): Payer: Self-pay

## 2016-04-09 ENCOUNTER — Encounter (HOSPITAL_COMMUNITY)
Admission: RE | Admit: 2016-04-09 | Discharge: 2016-04-09 | Disposition: A | Discharge status: Home / Self Care | Payer: PPO | Source: Ambulatory Visit | Attending: Orthopaedic Surgery | Admitting: Orthopaedic Surgery

## 2016-04-09 ENCOUNTER — Ambulatory Visit (HOSPITAL_COMMUNITY)
Admission: RE | Admit: 2016-04-09 | Discharge: 2016-04-09 | Disposition: A | Discharge status: Home / Self Care | Payer: PPO | Source: Ambulatory Visit | Attending: Orthopaedic Surgery | Admitting: Orthopaedic Surgery

## 2016-04-09 DIAGNOSIS — Z01810 Encounter for preprocedural cardiovascular examination: Secondary | ICD-10-CM | POA: Diagnosis not present

## 2016-04-09 DIAGNOSIS — Z01818 Encounter for other preprocedural examination: Secondary | ICD-10-CM | POA: Diagnosis not present

## 2016-04-09 DIAGNOSIS — R9431 Abnormal electrocardiogram [ECG] [EKG]: Secondary | ICD-10-CM | POA: Diagnosis not present

## 2016-04-09 HISTORY — DX: Sleep apnea, unspecified: G47.30

## 2016-04-09 HISTORY — DX: Unspecified osteoarthritis, unspecified site: M19.90

## 2016-04-09 HISTORY — DX: Hypothyroidism, unspecified: E03.9

## 2016-04-09 HISTORY — DX: Myoneural disorder, unspecified: G70.9

## 2016-04-09 HISTORY — DX: Essential (primary) hypertension: I10

## 2016-04-09 HISTORY — DX: Gastro-esophageal reflux disease without esophagitis: K21.9

## 2016-04-09 LAB — URINALYSIS, ROUTINE W REFLEX MICROSCOPIC
Bilirubin Urine: NEGATIVE
GLUCOSE, UA: NEGATIVE mg/dL
Hgb urine dipstick: NEGATIVE
Ketones, ur: NEGATIVE mg/dL
LEUKOCYTES UA: NEGATIVE
Nitrite: NEGATIVE
PH: 7 (ref 5.0–8.0)
Protein, ur: NEGATIVE mg/dL
Specific Gravity, Urine: 1.014 (ref 1.005–1.030)

## 2016-04-09 LAB — CBC WITH DIFFERENTIAL/PLATELET
BASOS PCT: 1 %
Basophils Absolute: 0 10*3/uL (ref 0.0–0.1)
EOS ABS: 0.6 10*3/uL (ref 0.0–0.7)
Eosinophils Relative: 8 %
HCT: 46.1 % (ref 39.0–52.0)
Hemoglobin: 15 g/dL (ref 13.0–17.0)
Lymphocytes Relative: 18 %
Lymphs Abs: 1.2 10*3/uL (ref 0.7–4.0)
MCH: 29.2 pg (ref 26.0–34.0)
MCHC: 32.5 g/dL (ref 30.0–36.0)
MCV: 89.9 fL (ref 78.0–100.0)
MONO ABS: 0.3 10*3/uL (ref 0.1–1.0)
MONOS PCT: 5 %
NEUTROS PCT: 68 %
Neutro Abs: 4.8 10*3/uL (ref 1.7–7.7)
Platelets: 227 10*3/uL (ref 150–400)
RBC: 5.13 MIL/uL (ref 4.22–5.81)
RDW: 13.3 % (ref 11.5–15.5)
WBC: 6.9 10*3/uL (ref 4.0–10.5)

## 2016-04-09 LAB — BASIC METABOLIC PANEL
Anion gap: 7 (ref 5–15)
BUN: 12 mg/dL (ref 6–20)
CALCIUM: 9.1 mg/dL (ref 8.9–10.3)
CO2: 24 mmol/L (ref 22–32)
CREATININE: 1.06 mg/dL (ref 0.61–1.24)
Chloride: 106 mmol/L (ref 101–111)
GFR calc non Af Amer: >60 mL/min (ref >60)
GLUCOSE: 142 mg/dL — AB (ref 65–99)
Potassium: 4.2 mmol/L (ref 3.5–5.1)
Sodium: 137 mmol/L (ref 135–145)

## 2016-04-09 LAB — PROTIME-INR
INR: 1.04
PROTHROMBIN TIME: 13.6 s (ref 11.4–15.2)

## 2016-04-09 LAB — ABO/RH: ABO/RH(D): B POS

## 2016-04-09 LAB — SURGICAL PCR SCREEN
MRSA, PCR: NEGATIVE
Staphylococcus aureus: POSITIVE — AB

## 2016-04-09 LAB — APTT: aPTT: 28 seconds (ref 24–36)

## 2016-04-09 LAB — TYPE AND SCREEN
ABO/RH(D): B POS
Antibody Screen: NEGATIVE

## 2016-04-09 NOTE — Progress Notes (Signed)
prescription called in at [facility] [phone], patinet contacted about prescription

## 2016-04-09 NOTE — Progress Notes (Signed)
PCP - Lavone Orn Cardiologist - denies  Chest x-ray - 04/09/16 EKG - 04/09/16 Stress Test - denies ECHO - denies Cardiac Cath - denies  Sleep study completed years ago instructed patient to bring mask the day of surgery  Patient denies shortness of breath, fever, cough and chest pain at PAT appointment

## 2016-04-15 DIAGNOSIS — G4733 Obstructive sleep apnea (adult) (pediatric): Secondary | ICD-10-CM | POA: Diagnosis not present
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
## 2016-04-17 MED ORDER — DEXTROSE 5 % IV SOLN
3.0000 g | INTRAVENOUS | Status: AC
  Administered 2016-04-21: 3 g via INTRAVENOUS
  Filled 2016-04-17: qty 3000

## 2016-04-21 ENCOUNTER — Encounter (HOSPITAL_COMMUNITY): Payer: Self-pay | Admitting: Anesthesiology

## 2016-04-21 ENCOUNTER — Inpatient Hospital Stay (HOSPITAL_COMMUNITY): Payer: PPO | Admitting: Anesthesiology

## 2016-04-21 ENCOUNTER — Inpatient Hospital Stay (HOSPITAL_COMMUNITY)
Admission: RE | Admit: 2016-04-21 | Discharge: 2016-04-24 | DRG: 470 | Disposition: A | Discharge status: Skilled Nursing Facility | Payer: PPO | Source: Ambulatory Visit | Attending: Orthopaedic Surgery | Admitting: Orthopaedic Surgery

## 2016-04-21 ENCOUNTER — Encounter (HOSPITAL_COMMUNITY)
Admission: RE | Disposition: A | Discharge status: Skilled Nursing Facility | Payer: Self-pay | Source: Ambulatory Visit | Attending: Orthopaedic Surgery

## 2016-04-21 DIAGNOSIS — I10 Essential (primary) hypertension: Secondary | ICD-10-CM | POA: Diagnosis present

## 2016-04-21 DIAGNOSIS — E039 Hypothyroidism, unspecified: Secondary | ICD-10-CM | POA: Diagnosis not present

## 2016-04-21 DIAGNOSIS — K219 Gastro-esophageal reflux disease without esophagitis: Secondary | ICD-10-CM | POA: Diagnosis present

## 2016-04-21 DIAGNOSIS — M1711 Unilateral primary osteoarthritis, right knee: Principal | ICD-10-CM | POA: Diagnosis present

## 2016-04-21 DIAGNOSIS — J310 Chronic rhinitis: Secondary | ICD-10-CM | POA: Diagnosis present

## 2016-04-21 DIAGNOSIS — M25561 Pain in right knee: Secondary | ICD-10-CM | POA: Diagnosis not present

## 2016-04-21 DIAGNOSIS — G4730 Sleep apnea, unspecified: Secondary | ICD-10-CM | POA: Diagnosis present

## 2016-04-21 DIAGNOSIS — J45901 Unspecified asthma with (acute) exacerbation: Secondary | ICD-10-CM

## 2016-04-21 DIAGNOSIS — M179 Osteoarthritis of knee, unspecified: Secondary | ICD-10-CM | POA: Diagnosis not present

## 2016-04-21 HISTORY — PX: TOTAL KNEE ARTHROPLASTY: SHX125

## 2016-04-21 SURGERY — ARTHROPLASTY, KNEE, TOTAL
Anesthesia: Monitor Anesthesia Care | Site: Knee | Laterality: Right

## 2016-04-21 MED ORDER — PHENOL 1.4 % MT LIQD: 1.0000 | OROMUCOSAL | Status: DC | PRN

## 2016-04-21 MED ORDER — METHOCARBAMOL 1000 MG/10ML IJ SOLN
500.0000 mg | Freq: Four times a day (QID) | INTRAVENOUS | Status: DC | PRN
  Filled 2016-04-21: qty 5

## 2016-04-21 MED ORDER — FENTANYL CITRATE (PF) 100 MCG/2ML IJ SOLN
INTRAMUSCULAR | Status: AC
  Filled 2016-04-21: qty 2

## 2016-04-21 MED ORDER — GABAPENTIN 300 MG PO CAPS
600.0000 mg | ORAL_CAPSULE | Freq: Every day | ORAL | Status: DC
  Administered 2016-04-21 – 2016-04-23 (×3): 600 mg via ORAL
  Filled 2016-04-21 (×3): qty 2

## 2016-04-21 MED ORDER — ACETAMINOPHEN 325 MG PO TABS: 650.0000 mg | ORAL_TABLET | Freq: Four times a day (QID) | ORAL | Status: DC | PRN

## 2016-04-21 MED ORDER — MIDAZOLAM HCL 2 MG/2ML IJ SOLN
INTRAMUSCULAR | Status: AC
  Filled 2016-04-21: qty 2

## 2016-04-21 MED ORDER — FAMOTIDINE 20 MG PO TABS: 20.0000 mg | ORAL_TABLET | Freq: Every day | ORAL | Status: DC

## 2016-04-21 MED ORDER — METHOCARBAMOL 500 MG PO TABS
ORAL_TABLET | ORAL | Status: AC
  Filled 2016-04-21: qty 1

## 2016-04-21 MED ORDER — OXYCODONE HCL 5 MG PO TABS
5.0000 mg | ORAL_TABLET | Freq: Once | ORAL | Status: AC
  Administered 2016-04-21: 5 mg via ORAL

## 2016-04-21 MED ORDER — DIPHENHYDRAMINE HCL 12.5 MG/5ML PO ELIX: 12.5000 mg | ORAL_SOLUTION | ORAL | Status: DC | PRN

## 2016-04-21 MED ORDER — EPHEDRINE SULFATE 50 MG/ML IJ SOLN
INTRAMUSCULAR | Status: DC | PRN
  Administered 2016-04-21 (×6): 5 mg via INTRAVENOUS
  Administered 2016-04-21: 10 mg via INTRAVENOUS

## 2016-04-21 MED ORDER — MIDAZOLAM HCL 5 MG/5ML IJ SOLN
INTRAMUSCULAR | Status: DC | PRN
  Administered 2016-04-21 (×2): 1 mg via INTRAVENOUS

## 2016-04-21 MED ORDER — SODIUM CHLORIDE 0.9 % IJ SOLN
INTRAMUSCULAR | Status: DC | PRN
  Administered 2016-04-21: 40 mL via INTRAVENOUS

## 2016-04-21 MED ORDER — BUPIVACAINE LIPOSOME 1.3 % IJ SUSP
20.0000 mL | INTRAMUSCULAR | Status: AC
  Administered 2016-04-21: 20 mL
  Filled 2016-04-21: qty 20

## 2016-04-21 MED ORDER — CEFAZOLIN SODIUM 10 G IJ SOLR
3.0000 g | Freq: Four times a day (QID) | INTRAMUSCULAR | Status: AC
  Administered 2016-04-21 (×2): 3 g via INTRAVENOUS
  Filled 2016-04-21 (×3): qty 3000

## 2016-04-21 MED ORDER — EPHEDRINE 5 MG/ML INJ
INTRAVENOUS | Status: AC
  Filled 2016-04-21: qty 20

## 2016-04-21 MED ORDER — FLUTICASONE PROPIONATE 50 MCG/ACT NA SUSP
1.0000 | Freq: Every day | NASAL | Status: DC
  Administered 2016-04-22 – 2016-04-24 (×2): 1 via NASAL
  Filled 2016-04-21: qty 16

## 2016-04-21 MED ORDER — ROCURONIUM BROMIDE 10 MG/ML (PF) SYRINGE
PREFILLED_SYRINGE | INTRAVENOUS | Status: AC
  Filled 2016-04-21: qty 10

## 2016-04-21 MED ORDER — METOCLOPRAMIDE HCL 5 MG PO TABS: 5.0000 mg | ORAL_TABLET | Freq: Three times a day (TID) | ORAL | Status: DC | PRN

## 2016-04-21 MED ORDER — ONDANSETRON HCL 4 MG/2ML IJ SOLN
4.0000 mg | Freq: Four times a day (QID) | INTRAMUSCULAR | Status: DC | PRN
  Administered 2016-04-21 – 2016-04-22 (×2): 4 mg via INTRAVENOUS
  Filled 2016-04-21 (×2): qty 2

## 2016-04-21 MED ORDER — ASPIRIN EC 325 MG PO TBEC
325.0000 mg | DELAYED_RELEASE_TABLET | Freq: Two times a day (BID) | ORAL | Status: DC
  Administered 2016-04-22 – 2016-04-24 (×5): 325 mg via ORAL
  Filled 2016-04-21 (×5): qty 1

## 2016-04-21 MED ORDER — BUPIVACAINE IN DEXTROSE 0.75-8.25 % IT SOLN
INTRATHECAL | Status: DC | PRN
  Administered 2016-04-21: 1.6 mL via INTRATHECAL

## 2016-04-21 MED ORDER — LOSARTAN POTASSIUM 50 MG PO TABS
50.0000 mg | ORAL_TABLET | Freq: Every day | ORAL | Status: DC
Start: 2016-04-22 — End: 2016-04-24
  Administered 2016-04-22 – 2016-04-24 (×3): 50 mg via ORAL
  Filled 2016-04-21 (×3): qty 1

## 2016-04-21 MED ORDER — PROPOFOL 10 MG/ML IV BOLUS
INTRAVENOUS | Status: AC
  Filled 2016-04-21: qty 20

## 2016-04-21 MED ORDER — DOCUSATE SODIUM 100 MG PO CAPS
100.0000 mg | ORAL_CAPSULE | Freq: Two times a day (BID) | ORAL | Status: DC
  Administered 2016-04-22 – 2016-04-24 (×5): 100 mg via ORAL
  Filled 2016-04-21 (×6): qty 1

## 2016-04-21 MED ORDER — MENTHOL 3 MG MT LOZG: 1.0000 | LOZENGE | OROMUCOSAL | Status: DC | PRN

## 2016-04-21 MED ORDER — AZELASTINE HCL 0.1 % NA SOLN
1.0000 | Freq: Two times a day (BID) | NASAL | Status: DC
  Administered 2016-04-21 – 2016-04-24 (×6): 2 via NASAL
  Filled 2016-04-21: qty 30

## 2016-04-21 MED ORDER — METOCLOPRAMIDE HCL 5 MG/ML IJ SOLN
5.0000 mg | Freq: Three times a day (TID) | INTRAMUSCULAR | Status: DC | PRN
  Administered 2016-04-21 – 2016-04-22 (×2): 10 mg via INTRAVENOUS
  Filled 2016-04-21 (×2): qty 2

## 2016-04-21 MED ORDER — LIDOCAINE 2% (20 MG/ML) 5 ML SYRINGE
INTRAMUSCULAR | Status: AC
  Filled 2016-04-21: qty 5

## 2016-04-21 MED ORDER — TRANEXAMIC ACID 1000 MG/10ML IV SOLN
2000.0000 mg | INTRAVENOUS | Status: AC
  Administered 2016-04-21: 2000 mg via TOPICAL
  Filled 2016-04-21: qty 20

## 2016-04-21 MED ORDER — SUCCINYLCHOLINE CHLORIDE 200 MG/10ML IV SOSY
PREFILLED_SYRINGE | INTRAVENOUS | Status: AC
  Filled 2016-04-21: qty 10

## 2016-04-21 MED ORDER — ONDANSETRON HCL 4 MG/2ML IJ SOLN
INTRAMUSCULAR | Status: AC
  Filled 2016-04-21: qty 2

## 2016-04-21 MED ORDER — TAMSULOSIN HCL 0.4 MG PO CAPS: 0.4000 mg | ORAL_CAPSULE | Freq: Every day | ORAL | Status: DC

## 2016-04-21 MED ORDER — SODIUM CHLORIDE 0.9 % IV SOLN
1000.0000 mg | Freq: Once | INTRAVENOUS | Status: DC
  Filled 2016-04-21: qty 10

## 2016-04-21 MED ORDER — ONDANSETRON HCL 4 MG PO TABS: 4.0000 mg | ORAL_TABLET | Freq: Four times a day (QID) | ORAL | Status: DC | PRN

## 2016-04-21 MED ORDER — BUPIVACAINE-EPINEPHRINE (PF) 0.5% -1:200000 IJ SOLN
INTRAMUSCULAR | Status: AC
  Filled 2016-04-21: qty 30

## 2016-04-21 MED ORDER — ACETAMINOPHEN 650 MG RE SUPP: 650.0000 mg | Freq: Four times a day (QID) | RECTAL | Status: DC | PRN

## 2016-04-21 MED ORDER — HYDROMORPHONE HCL 1 MG/ML IJ SOLN
0.5000 mg | INTRAMUSCULAR | Status: DC | PRN
  Administered 2016-04-21 – 2016-04-23 (×8): 1 mg via INTRAVENOUS
  Administered 2016-04-23: 0.5 mg via INTRAVENOUS
  Filled 2016-04-21 (×9): qty 1

## 2016-04-21 MED ORDER — LACTATED RINGERS IV SOLN
INTRAVENOUS | Status: DC
  Administered 2016-04-21 (×3): via INTRAVENOUS

## 2016-04-21 MED ORDER — LEVOTHYROXINE SODIUM 100 MCG PO TABS
200.0000 ug | ORAL_TABLET | Freq: Every day | ORAL | Status: DC
  Administered 2016-04-22 – 2016-04-24 (×3): 200 ug via ORAL
  Filled 2016-04-21 (×4): qty 2

## 2016-04-21 MED ORDER — PHENYLEPHRINE 40 MCG/ML (10ML) SYRINGE FOR IV PUSH (FOR BLOOD PRESSURE SUPPORT)
PREFILLED_SYRINGE | INTRAVENOUS | Status: AC
  Filled 2016-04-21: qty 10

## 2016-04-21 MED ORDER — ALUM & MAG HYDROXIDE-SIMETH 200-200-20 MG/5ML PO SUSP: 30.0000 mL | ORAL | Status: DC | PRN

## 2016-04-21 MED ORDER — EPHEDRINE 5 MG/ML INJ
INTRAVENOUS | Status: AC
  Filled 2016-04-21: qty 10

## 2016-04-21 MED ORDER — PROPOFOL 500 MG/50ML IV EMUL
INTRAVENOUS | Status: AC
  Filled 2016-04-21: qty 100

## 2016-04-21 MED ORDER — 0.9 % SODIUM CHLORIDE (POUR BTL) OPTIME
TOPICAL | Status: DC | PRN
  Administered 2016-04-21: 1000 mL

## 2016-04-21 MED ORDER — LACTATED RINGERS IV SOLN
INTRAVENOUS | Status: DC
  Administered 2016-04-21 – 2016-04-22 (×2): via INTRAVENOUS

## 2016-04-21 MED ORDER — MOMETASONE FURO-FORMOTEROL FUM 200-5 MCG/ACT IN AERO
2.0000 | INHALATION_SPRAY | Freq: Two times a day (BID) | RESPIRATORY_TRACT | Status: DC
  Administered 2016-04-21 – 2016-04-24 (×6): 2 via RESPIRATORY_TRACT
  Filled 2016-04-21: qty 8.8

## 2016-04-21 MED ORDER — PROPOFOL 500 MG/50ML IV EMUL
INTRAVENOUS | Status: DC | PRN
  Administered 2016-04-21: 50 ug/kg/min via INTRAVENOUS

## 2016-04-21 MED ORDER — PROPOFOL 1000 MG/100ML IV EMUL
INTRAVENOUS | Status: AC
  Filled 2016-04-21: qty 300

## 2016-04-21 MED ORDER — FENTANYL CITRATE (PF) 100 MCG/2ML IJ SOLN
25.0000 ug | INTRAMUSCULAR | Status: DC | PRN
  Administered 2016-04-21: 50 ug via INTRAVENOUS
  Administered 2016-04-21: 25 ug via INTRAVENOUS
  Administered 2016-04-21: 50 ug via INTRAVENOUS
  Administered 2016-04-21: 25 ug via INTRAVENOUS

## 2016-04-21 MED ORDER — FENTANYL CITRATE (PF) 100 MCG/2ML IJ SOLN
INTRAMUSCULAR | Status: DC | PRN
  Administered 2016-04-21 (×4): 50 ug via INTRAVENOUS

## 2016-04-21 MED ORDER — OXYCODONE HCL 5 MG PO TABS
ORAL_TABLET | ORAL | Status: AC
  Filled 2016-04-21: qty 1

## 2016-04-21 MED ORDER — PROMETHAZINE HCL 25 MG/ML IJ SOLN: 6.2500 mg | INTRAMUSCULAR | Status: DC | PRN

## 2016-04-21 MED ORDER — PANTOPRAZOLE SODIUM 40 MG PO TBEC
80.0000 mg | DELAYED_RELEASE_TABLET | Freq: Every day | ORAL | Status: DC
  Administered 2016-04-21 – 2016-04-24 (×4): 80 mg via ORAL
  Filled 2016-04-21 (×4): qty 2

## 2016-04-21 MED ORDER — SODIUM CHLORIDE 0.9 % IR SOLN
Status: DC | PRN
  Administered 2016-04-21: 1000 mL

## 2016-04-21 MED ORDER — METHOCARBAMOL 500 MG PO TABS
500.0000 mg | ORAL_TABLET | Freq: Four times a day (QID) | ORAL | Status: DC | PRN
  Administered 2016-04-21 – 2016-04-22 (×3): 500 mg via ORAL
  Filled 2016-04-21 (×2): qty 1

## 2016-04-21 MED ORDER — CHLORHEXIDINE GLUCONATE 4 % EX LIQD: 60.0000 mL | Freq: Once | CUTANEOUS | Status: DC

## 2016-04-21 MED ORDER — PHENYLEPHRINE HCL 10 MG/ML IJ SOLN
INTRAMUSCULAR | Status: DC | PRN
  Administered 2016-04-21: 80 ug via INTRAVENOUS
  Administered 2016-04-21 (×8): 40 ug via INTRAVENOUS

## 2016-04-21 MED ORDER — BUPIVACAINE-EPINEPHRINE (PF) 0.5% -1:200000 IJ SOLN
INTRAMUSCULAR | Status: DC | PRN
  Administered 2016-04-21: 20 mL via PERINEURAL

## 2016-04-21 MED ORDER — BISACODYL 5 MG PO TBEC
5.0000 mg | DELAYED_RELEASE_TABLET | Freq: Every day | ORAL | Status: DC | PRN
Start: 2016-04-21 — End: 2016-04-24

## 2016-04-21 MED ORDER — SODIUM CHLORIDE 0.9 % IV SOLN
1000.0000 mg | INTRAVENOUS | Status: AC
  Administered 2016-04-21: 1000 mg via INTRAVENOUS
  Filled 2016-04-21: qty 10

## 2016-04-21 MED ORDER — SUGAMMADEX SODIUM 500 MG/5ML IV SOLN
INTRAVENOUS | Status: AC
  Filled 2016-04-21: qty 5

## 2016-04-21 MED ORDER — ONDANSETRON HCL 4 MG/2ML IJ SOLN
INTRAMUSCULAR | Status: DC | PRN
  Administered 2016-04-21: 4 mg via INTRAVENOUS

## 2016-04-21 MED ORDER — FENTANYL CITRATE (PF) 100 MCG/2ML IJ SOLN
INTRAMUSCULAR | Status: AC
  Administered 2016-04-21: 25 ug via INTRAVENOUS
  Filled 2016-04-21: qty 2

## 2016-04-21 MED ORDER — HYDROCODONE-ACETAMINOPHEN 5-325 MG PO TABS
1.0000 | ORAL_TABLET | ORAL | Status: DC | PRN
  Administered 2016-04-21 – 2016-04-24 (×18): 2 via ORAL
  Filled 2016-04-21 (×18): qty 2

## 2016-04-21 SURGICAL SUPPLY — 67 items
BAG DECANTER FOR FLEXI CONT (MISCELLANEOUS) ×3 IMPLANT
BANDAGE ACE 4X5 VEL STRL LF (GAUZE/BANDAGES/DRESSINGS) ×3 IMPLANT
BANDAGE ESMARK 6X9 LF (GAUZE/BANDAGES/DRESSINGS) ×1 IMPLANT
BLADE SAGITTAL 25.0X1.19X90 (BLADE) ×2 IMPLANT
BLADE SAGITTAL 25.0X1.19X90MM (BLADE) ×1
BLADE SAW SGTL 13.0X1.19X90.0M (BLADE) IMPLANT
BLADE SURG ROTATE 9660 (MISCELLANEOUS) IMPLANT
BNDG ELASTIC 6X10 VLCR STRL LF (GAUZE/BANDAGES/DRESSINGS) ×3 IMPLANT
BNDG ESMARK 6X9 LF (GAUZE/BANDAGES/DRESSINGS) ×3
BNDG GAUZE ELAST 4 BULKY (GAUZE/BANDAGES/DRESSINGS) ×6 IMPLANT
BOWL SMART MIX CTS (DISPOSABLE) ×3 IMPLANT
CAP KNEE TOTAL 3 SIGMA ×3 IMPLANT
CEMENT HV SMART SET (Cement) ×6 IMPLANT
CLOSURE WOUND 1/2 X4 (GAUZE/BANDAGES/DRESSINGS) ×1
COVER SURGICAL LIGHT HANDLE (MISCELLANEOUS) ×3 IMPLANT
CUFF TOURNIQUET SINGLE 34IN LL (TOURNIQUET CUFF) ×3 IMPLANT
DECANTER SPIKE VIAL GLASS SM (MISCELLANEOUS) IMPLANT
DRAPE EXTREMITY T 121X128X90 (DRAPE) ×3 IMPLANT
DRAPE PROXIMA HALF (DRAPES) ×3 IMPLANT
DRAPE U-SHAPE 47X51 STRL (DRAPES) ×3 IMPLANT
DRSG ADAPTIC 3X8 NADH LF (GAUZE/BANDAGES/DRESSINGS) ×3 IMPLANT
DRSG PAD ABDOMINAL 8X10 ST (GAUZE/BANDAGES/DRESSINGS) ×3 IMPLANT
DURAPREP 26ML APPLICATOR (WOUND CARE) ×3 IMPLANT
ELECT REM PT RETURN 9FT ADLT (ELECTROSURGICAL) ×3
ELECTRODE REM PT RTRN 9FT ADLT (ELECTROSURGICAL) ×1 IMPLANT
GAUZE SPONGE 4X4 12PLY STRL (GAUZE/BANDAGES/DRESSINGS) ×3 IMPLANT
GLOVE BIO SURGEON STRL SZ8 (GLOVE) ×6 IMPLANT
GLOVE BIOGEL PI IND STRL 6.5 (GLOVE) ×1 IMPLANT
GLOVE BIOGEL PI IND STRL 7.0 (GLOVE) ×1 IMPLANT
GLOVE BIOGEL PI IND STRL 8 (GLOVE) ×2 IMPLANT
GLOVE BIOGEL PI INDICATOR 6.5 (GLOVE) ×2
GLOVE BIOGEL PI INDICATOR 7.0 (GLOVE) ×2
GLOVE BIOGEL PI INDICATOR 8 (GLOVE) ×4
GLOVE SURG SS PI 6.5 STRL IVOR (GLOVE) ×9 IMPLANT
GLOVE SURG SS PI 7.0 STRL IVOR (GLOVE) ×3 IMPLANT
GOWN STRL REUS W/ TWL LRG LVL3 (GOWN DISPOSABLE) ×2 IMPLANT
GOWN STRL REUS W/ TWL XL LVL3 (GOWN DISPOSABLE) ×2 IMPLANT
GOWN STRL REUS W/TWL LRG LVL3 (GOWN DISPOSABLE) ×4
GOWN STRL REUS W/TWL XL LVL3 (GOWN DISPOSABLE) ×4
HANDPIECE INTERPULSE COAX TIP (DISPOSABLE) ×2
HOOD PEEL AWAY FACE SHEILD DIS (HOOD) ×6 IMPLANT
IMMOBILIZER KNEE 22 UNIV (SOFTGOODS) ×3 IMPLANT
KIT BASIN OR (CUSTOM PROCEDURE TRAY) ×3 IMPLANT
KIT ROOM TURNOVER OR (KITS) ×3 IMPLANT
MANIFOLD NEPTUNE II (INSTRUMENTS) ×3 IMPLANT
NEEDLE HYPO 21X1 ECLIPSE (NEEDLE) ×3 IMPLANT
NS IRRIG 1000ML POUR BTL (IV SOLUTION) ×3 IMPLANT
PACK TOTAL JOINT (CUSTOM PROCEDURE TRAY) ×3 IMPLANT
PACK UNIVERSAL I (CUSTOM PROCEDURE TRAY) ×3 IMPLANT
PAD ARMBOARD 7.5X6 YLW CONV (MISCELLANEOUS) ×6 IMPLANT
SET HNDPC FAN SPRY TIP SCT (DISPOSABLE) ×1 IMPLANT
STAPLER VISISTAT 35W (STAPLE) IMPLANT
STRIP CLOSURE SKIN 1/2X4 (GAUZE/BANDAGES/DRESSINGS) ×2 IMPLANT
SUCTION FRAZIER HANDLE 10FR (MISCELLANEOUS)
SUCTION TUBE FRAZIER 10FR DISP (MISCELLANEOUS) IMPLANT
SUT MNCRL AB 3-0 PS2 18 (SUTURE) IMPLANT
SUT VIC AB 0 CT1 27 (SUTURE) ×4
SUT VIC AB 0 CT1 27XBRD ANBCTR (SUTURE) ×2 IMPLANT
SUT VIC AB 2-0 CT1 27 (SUTURE) ×4
SUT VIC AB 2-0 CT1 TAPERPNT 27 (SUTURE) ×2 IMPLANT
SUT VLOC 180 0 24IN GS25 (SUTURE) ×3 IMPLANT
SYR 50ML LL SCALE MARK (SYRINGE) ×3 IMPLANT
TOWEL OR 17X24 6PK STRL BLUE (TOWEL DISPOSABLE) ×3 IMPLANT
TOWEL OR 17X26 10 PK STRL BLUE (TOWEL DISPOSABLE) ×3 IMPLANT
TRAY FOLEY CATH 14FR (SET/KITS/TRAYS/PACK) ×3 IMPLANT
UPCHARGE REV TRAY MBT KNEE ×3 IMPLANT
WATER STERILE IRR 1000ML POUR (IV SOLUTION) ×6 IMPLANT
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
## 2016-04-21 NOTE — Anesthesia Procedure Notes (Signed)
Spinal  Patient location during procedure: OR Start time: 04/21/2016 9:40 AM End time: 04/21/2016 1:43 PM Staffing Anesthesiologist: Reginal Lutes Performed: anesthesiologist  Preanesthetic Checklist Completed: patient identified, site marked, surgical consent, pre-op evaluation, timeout performed, IV checked, risks and benefits discussed and monitors and equipment checked Spinal Block Patient position: sitting Prep: ChloraPrep Patient monitoring: continuous pulse ox, blood pressure, cardiac monitor and heart rate Approach: midline Location: L4-5 Injection technique: single-shot Needle Needle type: Quincke  Needle gauge: 22 G

## 2016-04-21 NOTE — Progress Notes (Signed)
Dr. Lamarr Lulas notified of PO water intake.

## 2016-04-21 NOTE — Transfer of Care (Signed)
Immediate Anesthesia Transfer of Care Note  Patient: Quamere Lemas Cavness  Procedure(s) Performed: Procedure(s): RIGHT TOTAL KNEE ARTHROPLASTY (Right)  Patient Location: PACU  Anesthesia Type:MAC and Spinal  Level of Consciousness: awake, alert , oriented and sedated  Airway & Oxygen Therapy: Patient Spontanous Breathing and Patient connected to nasal cannula oxygen  Post-op Assessment: Report given to RN, Post -op Vital signs reviewed and stable and Patient moving all extremities  Post vital signs: Reviewed and stable  Last Vitals:  Vitals:   04/21/16 0830 04/21/16 1209  BP: 139/87   Pulse: 76   Resp: 20   Temp: 36.9 C (P) 36.5 C    Last Pain:  Vitals:   04/21/16 1209  TempSrc:   PainSc: (P) 2          Complications: No apparent anesthesia complications

## 2016-04-21 NOTE — Anesthesia Preprocedure Evaluation (Signed)
Anesthesia Evaluation  Patient identified by MRN, date of birth, ID band Patient awake    Reviewed: Allergy & Precautions, NPO status , Patient's Chart, lab work & pertinent test results  Airway Mallampati: II  TM Distance: >3 FB Neck ROM: Full    Dental no notable dental hx.    Pulmonary asthma , sleep apnea and Continuous Positive Airway Pressure Ventilation ,    Pulmonary exam normal        Cardiovascular hypertension, Pt. on medications Normal cardiovascular exam     Neuro/Psych negative neurological ROS  negative psych ROS   GI/Hepatic Neg liver ROS, GERD  Medicated,  Endo/Other  Hypothyroidism   Renal/GU negative Renal ROS     Musculoskeletal  (+) Arthritis , Osteoarthritis,    Abdominal   Peds  Hematology   Anesthesia Other Findings   Reproductive/Obstetrics                             Anesthesia Physical Anesthesia Plan  ASA: III  Anesthesia Plan: Spinal   Post-op Pain Management:    Induction:   Airway Management Planned: Nasal Cannula  Additional Equipment:   Intra-op Plan:   Post-operative Plan:   Informed Consent: I have reviewed the patients History and Physical, chart, labs and discussed the procedure including the risks, benefits and alternatives for the proposed anesthesia with the patient or authorized representative who has indicated his/her understanding and acceptance.   Dental advisory given  Plan Discussed with:   Anesthesia Plan Comments:         Anesthesia Quick Evaluation

## 2016-04-21 NOTE — Progress Notes (Signed)
Pt states that he will self administer CPAP if he decides to wear it.  RT to monitor and assess as needed.

## 2016-04-21 NOTE — Anesthesia Procedure Notes (Signed)
Procedure Name: MAC Date/Time: 04/21/2016 9:40 AM Performed by: Scheryl Darter Pre-anesthesia Checklist: Patient identified, Emergency Drugs available, Suction available, Patient being monitored and Timeout performed Patient Re-evaluated:Patient Re-evaluated prior to inductionOxygen Delivery Method: Simple face mask Placement Confirmation: positive ETCO2

## 2016-04-21 NOTE — Progress Notes (Signed)
Orthopedic Tech Progress Note Patient Details:  James Henson [birth year] [id number]  CPM Right Knee CPM Right Knee: On Right Knee Flexion (Degrees): 90 Right Knee Extension (Degrees): 0 Additional Comments: Trapeze bar and foot roll   [name] 04/21/2016, 12:48 PM

## 2016-04-21 NOTE — Op Note (Signed)
PREOP DIAGNOSIS: DJD RIGHT KNEE POSTOP DIAGNOSIS: same PROCEDURE: RIGHT TKR ANESTHESIA: Spinal and MAC ATTENDING SURGEON: Margarit Minshall G ASSISTANT: James Dolly PA  INDICATIONS FOR PROCEDURE: James Henson is a 65 y.o. male who has struggled for a long time with pain due to degenerative arthritis of the right knee.  The patient has failed many conservative non-operative measures and at this point has pain which limits the ability to sleep and walk.  The patient is offered total knee replacement.  Informed operative consent was obtained after discussion of possible risks of anesthesia, infection, neurovascular injury, DVT, and death.  The importance of the post-operative rehabilitation protocol to optimize result was stressed extensively with the patient.  SUMMARY OF FINDINGS AND PROCEDURE:  James Henson was taken to the operative suite where under the above anesthesia a right knee replacement was performed.  There were advanced degenerative changes and the bone quality was excellent.  We used the DePuy LCS system and placed size large femur, 6 MBT revision tibia, 41 mm all polyethylene patella, and a size 10 mm spacer.  James Dolly PA-C assisted throughout and was invaluable to the completion of the case in that he helped retract and maintain exposure while I placed components.  He also helped close thereby minimizing OR time.  The patient was admitted for appropriate post-op care to include perioperative antibiotics and mechanical and pharmacologic measures for DVT prophylaxis.  DESCRIPTION OF PROCEDURE:  James Henson was taken to the operative suite where the above anesthesia was applied.  The patient was positioned supine and prepped and draped in normal sterile fashion.  An appropriate time out was performed.  After the administration of kefzol pre-op antibiotic the leg was elevated and exsanguinated and a tourniquet inflated. A standard longitudinal incision was made on the anterior  knee.  Dissection was carried down to the extensor mechanism.  All appropriate anti-infective measures were used including the pre-operative antibiotic, betadine impregnated drape, and closed hooded exhaust systems for each member of the surgical team.  A medial parapatellar incision was made in the extensor mechanism and the knee cap flipped and the knee flexed.  Some residual meniscal tissues were removed along with any remaining ACL/PCL tissue.  A guide was placed on the tibia and a flat cut was made on it's superior surface.  An intramedullary guide was placed in the femur and was utilized to make anterior and posterior cuts creating an appropriate flexion gap.  A second intramedullary guide was placed in the femur to make a distal cut properly balancing the knee with an extension gap equal to the flexion gap.  The three bones sized to the above mentioned sizes and the appropriate guides were placed and utilized.  A trial reduction was done and the knee easily came to full extension and the patella tracked well on flexion.  The trial components were removed and all bones were cleaned with pulsatile lavage and then dried thoroughly.  Cement was mixed and was pressurized onto the bones followed by placement of the aforementioned components.  Excess cement was trimmed and pressure was held on the components until the cement had hardened.  The tourniquet was deflated and a small amount of bleeding was controlled with cautery and pressure.  The knee was irrigated thoroughly.  The extensor mechanism was re-approximated with V-loc suture in running fashion.  The knee was flexed and the repair was solid.  The subcutaneous tissues were re-approximated with #0 and #2-0 vicryl and the skin closed  with a subcuticular stitch and steristrips.  A sterile dressing was applied.  Intraoperative fluids, EBL, and tourniquet time can be obtained from anesthesia records.  DISPOSITION:  The patient was taken to recovery room in  stable condition and admitted for appropriate post-op care to include peri-operative antibiotic and DVT prophylaxis with mechanical and pharmacologic measures.  Danyel Tobey G 04/21/2016, 11:36 AM

## 2016-04-21 NOTE — Progress Notes (Signed)
Orthopedic Tech Progress Note Patient Details:  James Henson 09/24/1950 [id number] Ortho visit put on cpm at [facility] Patient ID: [name], male   DOB: 1951/05/24, 65 y.o.   MRN: [id number]   Braulio Bosch 04/21/2016, 6:52 PM

## 2016-04-21 NOTE — Interval H&P Note (Signed)
History and Physical Interval Note:  04/21/2016 9:32 AM  James Henson  has presented today for surgery, with the diagnosis of RIGHT KNEE DEGENERATIVE JOINT DISEASE  The various methods of treatment have been discussed with the patient and family. After consideration of risks, benefits and other options for treatment, the patient has consented to  Procedure(s): RIGHT TOTAL KNEE ARTHROPLASTY (Right) as a surgical intervention .  The patient's history has been reviewed, patient examined, no change in status, stable for surgery.  I have reviewed the patient's chart and labs.  Questions were answered to the patient's satisfaction.     [name]

## 2016-04-21 NOTE — Anesthesia Postprocedure Evaluation (Signed)
Anesthesia Post Note  Patient: James Henson  Procedure(s) Performed: Procedure(s) (LRB): RIGHT TOTAL KNEE ARTHROPLASTY (Right)  Patient location during evaluation: PACU Anesthesia Type: Spinal Level of consciousness: oriented and awake and alert Pain management: pain level controlled Vital Signs Assessment: post-procedure vital signs reviewed and stable Respiratory status: spontaneous breathing, respiratory function stable and patient connected to nasal cannula oxygen Cardiovascular status: blood pressure returned to baseline and stable Postop Assessment: no headache and no backache Anesthetic complications: no    Last Vitals:  Vitals:   04/21/16 1211 04/21/16 1213  BP:    Pulse: 70 69  Resp: (!) 6 15  Temp:      Last Pain:  Vitals:   04/21/16 1209  TempSrc:   PainSc: 2                  Reginal Lutes

## 2016-04-22 ENCOUNTER — Encounter (HOSPITAL_COMMUNITY): Payer: Self-pay | Admitting: Orthopaedic Surgery

## 2016-04-22 MED ORDER — PROMETHAZINE HCL 25 MG PO TABS
12.5000 mg | ORAL_TABLET | Freq: Four times a day (QID) | ORAL | Status: DC | PRN
  Administered 2016-04-22: 25 mg via ORAL
  Filled 2016-04-22: qty 1

## 2016-04-22 MED ORDER — METHOCARBAMOL 750 MG PO TABS
750.0000 mg | ORAL_TABLET | Freq: Four times a day (QID) | ORAL | Status: DC | PRN
  Administered 2016-04-23 – 2016-04-24 (×4): 750 mg via ORAL
  Filled 2016-04-22 (×4): qty 1

## 2016-04-22 MED ORDER — METHOCARBAMOL 1000 MG/10ML IJ SOLN: 500.0000 mg | Freq: Four times a day (QID) | INTRAVENOUS | Status: DC | PRN

## 2016-04-22 NOTE — Progress Notes (Signed)
Orthopedic Tech Progress Note Patient Details:  James Henson [birth year] [id number] Ortho visit put on cpm at [facility] Patient ID: [name], male   DOB: 1951/08/17, 65 y.o.   MRN: [id number]   Braulio Bosch 04/22/2016, 6:50 PM

## 2016-04-22 NOTE — Progress Notes (Signed)
Orthopedic Tech Progress Note Patient Details:  James Henson [birth year] [id number]  Patient ID: James Henson, male   DOB: Oct 07, 1950, 65 y.o.   MRN: [id number] Applied cpm 0-50  Karolee Stamps 04/22/2016, 5:56 AM

## 2016-04-22 NOTE — Progress Notes (Signed)
Orthopedic Tech Progress Note Patient Details:  James Henson August 18, 1950 [id number]  Patient ID: James Henson, male   DOB: 21-Nov-1950, 65 y.o.   MRN: [id number]   [name] 04/22/2016, 2:16 PM Placed pt's rle on cpm @0 -50 degrees@ 1415; will increase as pt tolerates; RN notified
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
## 2016-04-23 NOTE — Progress Notes (Signed)
Patient places self on and off CPAP when ready 

## 2016-04-23 NOTE — Progress Notes (Signed)
Pt. States he can place cpap on himself when he is ready.

## 2016-04-23 NOTE — Progress Notes (Signed)
SW obtained PASRR: [id number] Krystal Clark, MSW [phone]

## 2016-04-23 NOTE — Progress Notes (Signed)
Subjective: 2 Days Post-Op Procedure(s) (LRB): RIGHT TOTAL KNEE ARTHROPLASTY (Right)  Activity level:  wbat Diet tolerance:  ok Voiding:  ok Patient reports pain as mild and moderate.    Objective: Vital signs in last 24 hours: Temp:  [99 F (37.2 C)-99.6 F (37.6 C)] 99.6 F (37.6 C) (09/07 0551) Pulse Rate:  [77-83] 77 (09/07 0551) Resp:  [16] 16 (09/07 0551) BP: (145-147)/(65) 145/65 (09/07 0551) SpO2:  [93 %-94 %] 93 % (09/07 0551)  Labs: No results for input(s): HGB in the last 72 hours. No results for input(s): WBC, RBC, HCT, PLT in the last 72 hours. No results for input(s): NA, K, CL, CO2, BUN, CREATININE, GLUCOSE, CALCIUM in the last 72 hours. No results for input(s): LABPT, INR in the last 72 hours.  Physical Exam:  Neurologically intact ABD soft Neurovascular intact Sensation intact distally Intact pulses distally Dorsiflexion/Plantar flexion intact Incision: dressing C/D/I and no drainage No cellulitis present Compartment soft  Assessment/Plan:  2 Days Post-Op Procedure(s) (LRB): RIGHT TOTAL KNEE ARTHROPLASTY (Right) Advance diet Up with therapy Plan for discharge tomorrow Discharge to SNF [facility]. Follow up in office 2 weeks post op. Continue on ASA 235mg  BID x 2 weeks post op. I changed dressing to aquacel today.    Marieann Zipp, [place] Sachs 04/23/2016, 3:52 PM

## 2016-04-23 NOTE — Progress Notes (Signed)
Orthopedic Tech Progress Note Patient Details:  James Henson 26-Sep-1950 [id number]  Patient ID: James Henson, male   DOB: July 22, 1951, 65 y.o.   MRN: [id number] Applied cpm 0-60   James Henson 04/23/2016, 5:48 AM

## 2016-04-23 NOTE — Clinical Social Work Placement (Signed)
   CLINICAL SOCIAL WORK PLACEMENT  NOTE  Date:  04/23/2016  Patient Details  Name: James Henson MRN: [id number] Date of Birth: 1950-09-15  Clinical Social Work is seeking post-discharge placement for this patient at the [place] level of care (*CSW will initial, date and re-position this form in  chart as items are completed):      Patient/family provided with [facility] Work Department's list of facilities offering this level of care within the geographic area requested by the patient (or if unable, by the patient's family).  Yes   Patient/family informed of their freedom to choose among providers that offer the needed level of care, that participate in Medicare, Medicaid or managed care program needed by the patient, have an available bed and are willing to accept the patient.  Yes   Patient/family informed of [facility]'s ownership interest in [facility] and [facility], as well as of the fact that they are under no obligation to receive care at these facilities.  PASRR submitted to EDS on       PASRR number received on       Existing PASRR number confirmed on       FL2 transmitted to all facilities in geographic area requested by pt/family on       FL2 transmitted to all facilities within larger geographic area on       Patient informed that his/her managed care company has contracts with or will negotiate with certain facilities, including the following:        Yes   Patient/family informed of bed offers received.  Patient [facility] bed at  [facility])     Physician recommends and patient chooses bed at      Patient to be transferred to  [facility] on 04/23/16.  Patient to be transferred to facility by  [facility])     Patient family notified on 04/23/16 of transfer.  Name of family member notified:        PHYSICIAN       Additional Comment:    _______________________________________________ Bernita Buffy 04/23/2016, 12:39 PM

## 2016-04-23 NOTE — Clinical Social Work Note (Signed)
Clinical Social Work Assessment  Patient Details  Name: James Henson MRN: [id number] Date of Birth: 1950-09-24  Date of referral:  04/23/16               Reason for consult:  Facility Placement                Permission sought to share information with:   (Facilities) Permission granted to share information::   Water engineer)  Name::        Agency::     Relationship::     Contact Information:     Housing/Transportation Living arrangements for the past 2 months:  Single Family Home (Patient is from home with his wife in [place].) Source of Information:  Patient Patient Interpreter Needed:  None Criminal Activity/Legal Involvement Pertinent to Current Situation/Hospitalization:  No - Comment as needed Significant Relationships:  Spouse Lives with:  Spouse Do you feel safe going back to the place where you live?   (Going to facility) Need for family participation in patient care:  Yes (Comment)  Care giving concerns:  Patient needs assistance with ADL. Patient states that he is from home with wife. He says that prior to surgery he completed ADLs independently. However, he states he now needs assistance.    Social Worker assessment / plan:  SW met with patient at bedside. Patient was alert and oriented. There was no family present. Patient states that he is interested in SNF. SW referred pt to facilities. Patient accepting.  Employment status:  Retired Forensic scientist:   Agricultural engineer) PT Recommendations:  [place] / Referral to community resources:   (SNF)  Patient/Family's Response to care:  Appropriate.   Patient/Family's Understanding of and Emotional Response to Diagnosis, Current Treatment, and Prognosis:  No questions for SW.  Emotional Assessment Appearance:  Appears stated age Attitude/Demeanor/Rapport:   (Appropriate.) Affect (typically observed):  Accepting Orientation:  Oriented to Self, Oriented to Place, Oriented to   Time, Oriented to Situation Alcohol / Substance use:    Psych involvement (Current and /or in the community):     Discharge Needs  Concerns to be addressed:  No discharge needs identified Readmission within the last 30 days:  No Current discharge risk:  None Barriers to Discharge:  No Barriers Identified   Tilda Burrow R 04/23/2016, 12:09 PM

## 2016-04-23 NOTE — Progress Notes (Signed)
Orthopedic Tech Progress Note Patient Details:  James Henson Oct 16, 1950 [id number] Ortho visit put on cpm at 1905. Patient ID: James Henson, male   DOB: Aug 04, 1951, 65 y.o.   MRN: [id number]   Braulio Bosch 04/23/2016, 8:43 PM
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
## 2016-04-24 DIAGNOSIS — Z471 Aftercare following joint replacement surgery: Secondary | ICD-10-CM | POA: Diagnosis not present

## 2016-04-24 DIAGNOSIS — M6281 Muscle weakness (generalized): Secondary | ICD-10-CM | POA: Diagnosis not present

## 2016-04-24 DIAGNOSIS — Z96651 Presence of right artificial knee joint: Secondary | ICD-10-CM | POA: Diagnosis not present

## 2016-04-24 DIAGNOSIS — R2689 Other abnormalities of gait and mobility: Secondary | ICD-10-CM | POA: Diagnosis not present

## 2016-04-24 DIAGNOSIS — M25569 Pain in unspecified knee: Secondary | ICD-10-CM | POA: Diagnosis not present

## 2016-04-24 DIAGNOSIS — G891 Acute pain, not elsewhere classified: Secondary | ICD-10-CM | POA: Diagnosis not present

## 2016-04-24 DIAGNOSIS — R278 Other lack of coordination: Secondary | ICD-10-CM | POA: Diagnosis not present

## 2016-04-24 MED ORDER — HYDROCODONE-ACETAMINOPHEN 5-325 MG PO TABS: 1.0000 | ORAL_TABLET | ORAL | 0 refills | Status: DC | PRN

## 2016-04-24 MED ORDER — ASPIRIN 325 MG PO TBEC: 325.0000 mg | DELAYED_RELEASE_TABLET | Freq: Two times a day (BID) | ORAL | 0 refills | Status: DC

## 2016-04-24 MED ORDER — METHOCARBAMOL 750 MG PO TABS: 750.0000 mg | ORAL_TABLET | Freq: Four times a day (QID) | ORAL | 0 refills | Status: DC | PRN

## 2016-04-24 MED ORDER — BISACODYL 10 MG RE SUPP
10.0000 mg | Freq: Every day | RECTAL | Status: DC | PRN
  Administered 2016-04-24: 10 mg via RECTAL
  Filled 2016-04-24: qty 1

## 2016-04-24 NOTE — Care Management Important Message (Signed)
Important Message  Patient Details  Name: James Henson MRN: [id number] Date of Birth: 01-29-1951   Medicare Important Message Given:  Yes    Orbie Pyo 04/24/2016, 10:36 AM

## 2016-04-24 NOTE — Clinical Social Work Note (Signed)
Patient will discharge today per MD order. Patient will discharge to: [facility] SNF RN to call report prior to transportation to: [phone] Transportation: [facility]  CSW sent discharge summary to SNF for review.    Nonnie Done, MSW, LCSW  ([id number]) [id number]  Licensed Clinical Social Worker

## 2016-04-24 NOTE — Progress Notes (Signed)
Tried calling report to [facility].  Was forwarded to a voicemail.  Left message with my number.  I will call them back if I do not hear from them.
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
## 2016-04-24 NOTE — Progress Notes (Signed)
Reviewed discharge information/medication with patient.  Answered all of his questions.  Patient is ready to be discharged.

## 2016-04-27 ENCOUNTER — Encounter: Payer: Self-pay | Admitting: Adult Health

## 2016-04-27 ENCOUNTER — Non-Acute Institutional Stay (SKILLED_NURSING_FACILITY): Payer: PPO | Admitting: Adult Health

## 2016-04-27 DIAGNOSIS — R339 Retention of urine, unspecified: Secondary | ICD-10-CM

## 2016-04-27 DIAGNOSIS — J45909 Unspecified asthma, uncomplicated: Secondary | ICD-10-CM | POA: Diagnosis not present

## 2016-04-27 DIAGNOSIS — M1711 Unilateral primary osteoarthritis, right knee: Secondary | ICD-10-CM

## 2016-04-27 DIAGNOSIS — G629 Polyneuropathy, unspecified: Secondary | ICD-10-CM | POA: Diagnosis not present

## 2016-04-27 DIAGNOSIS — K219 Gastro-esophageal reflux disease without esophagitis: Secondary | ICD-10-CM | POA: Diagnosis not present

## 2016-04-27 DIAGNOSIS — K5901 Slow transit constipation: Secondary | ICD-10-CM

## 2016-04-27 DIAGNOSIS — E039 Hypothyroidism, unspecified: Secondary | ICD-10-CM

## 2016-04-27 DIAGNOSIS — R2681 Unsteadiness on feet: Secondary | ICD-10-CM

## 2016-04-27 DIAGNOSIS — I10 Essential (primary) hypertension: Secondary | ICD-10-CM | POA: Diagnosis not present

## 2016-04-27 DIAGNOSIS — J309 Allergic rhinitis, unspecified: Secondary | ICD-10-CM | POA: Diagnosis not present
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
## 2016-04-28 ENCOUNTER — Non-Acute Institutional Stay (SKILLED_NURSING_FACILITY): Payer: PPO | Admitting: Internal Medicine

## 2016-04-28 ENCOUNTER — Encounter: Payer: Self-pay | Admitting: Internal Medicine

## 2016-04-28 DIAGNOSIS — I10 Essential (primary) hypertension: Secondary | ICD-10-CM

## 2016-04-28 DIAGNOSIS — E079 Disorder of thyroid, unspecified: Secondary | ICD-10-CM

## 2016-04-28 DIAGNOSIS — R2681 Unsteadiness on feet: Secondary | ICD-10-CM | POA: Diagnosis not present

## 2016-04-28 DIAGNOSIS — M25661 Stiffness of right knee, not elsewhere classified: Secondary | ICD-10-CM | POA: Diagnosis not present

## 2016-04-28 DIAGNOSIS — J4520 Mild intermittent asthma, uncomplicated: Secondary | ICD-10-CM

## 2016-04-28 DIAGNOSIS — M25561 Pain in right knee: Secondary | ICD-10-CM | POA: Diagnosis not present

## 2016-04-28 DIAGNOSIS — M6281 Muscle weakness (generalized): Secondary | ICD-10-CM | POA: Diagnosis not present

## 2016-04-28 DIAGNOSIS — R600 Localized edema: Secondary | ICD-10-CM | POA: Diagnosis not present

## 2016-04-28 DIAGNOSIS — G629 Polyneuropathy, unspecified: Secondary | ICD-10-CM | POA: Diagnosis not present

## 2016-04-28 DIAGNOSIS — R339 Retention of urine, unspecified: Secondary | ICD-10-CM

## 2016-04-28 DIAGNOSIS — G4700 Insomnia, unspecified: Secondary | ICD-10-CM

## 2016-04-28 DIAGNOSIS — Z5189 Encounter for other specified aftercare: Secondary | ICD-10-CM | POA: Diagnosis not present

## 2016-04-28 DIAGNOSIS — K5901 Slow transit constipation: Secondary | ICD-10-CM

## 2016-04-28 DIAGNOSIS — K219 Gastro-esophageal reflux disease without esophagitis: Secondary | ICD-10-CM

## 2016-04-28 DIAGNOSIS — R262 Difficulty in walking, not elsewhere classified: Secondary | ICD-10-CM | POA: Diagnosis not present

## 2016-04-28 DIAGNOSIS — M1711 Unilateral primary osteoarthritis, right knee: Secondary | ICD-10-CM | POA: Diagnosis not present
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
## 2016-04-29 DIAGNOSIS — M6281 Muscle weakness (generalized): Secondary | ICD-10-CM | POA: Diagnosis not present

## 2016-04-29 DIAGNOSIS — E039 Hypothyroidism, unspecified: Secondary | ICD-10-CM | POA: Diagnosis not present

## 2016-04-29 DIAGNOSIS — R600 Localized edema: Secondary | ICD-10-CM | POA: Diagnosis not present

## 2016-04-29 DIAGNOSIS — R262 Difficulty in walking, not elsewhere classified: Secondary | ICD-10-CM | POA: Diagnosis not present

## 2016-04-29 DIAGNOSIS — M25661 Stiffness of right knee, not elsewhere classified: Secondary | ICD-10-CM | POA: Diagnosis not present

## 2016-04-29 DIAGNOSIS — D649 Anemia, unspecified: Secondary | ICD-10-CM | POA: Diagnosis not present

## 2016-04-29 DIAGNOSIS — R2681 Unsteadiness on feet: Secondary | ICD-10-CM | POA: Diagnosis not present

## 2016-04-29 DIAGNOSIS — Z5189 Encounter for other specified aftercare: Secondary | ICD-10-CM | POA: Diagnosis not present

## 2016-04-29 DIAGNOSIS — M25561 Pain in right knee: Secondary | ICD-10-CM | POA: Diagnosis not present

## 2016-04-29 LAB — CBC AND DIFFERENTIAL
HCT: 42 % (ref 41–53)
HEMOGLOBIN: 13.5 g/dL (ref 13.5–17.5)
Neutrophils Absolute: 6 /uL
Platelets: 377 10*3/uL (ref 150–399)
WBC: 8.6 10^3/mL

## 2016-04-29 LAB — BASIC METABOLIC PANEL
BUN: 14 mg/dL (ref 4–21)
CREATININE: 0.9 mg/dL (ref 0.6–1.3)
Glucose: 99 mg/dL
POTASSIUM: 4.2 mmol/L (ref 3.4–5.3)
Sodium: 138 mmol/L (ref 137–147)

## 2016-04-29 LAB — TSH: TSH: 10.24 u[IU]/mL — AB (ref 0.41–5.90)

## 2016-04-29 LAB — HEPATIC FUNCTION PANEL
ALK PHOS: 75 U/L (ref 25–125)
ALT: 20 U/L (ref 10–40)
AST: 19 U/L (ref 14–40)
Bilirubin, Total: 0.7 mg/dL

## 2016-04-30 ENCOUNTER — Encounter: Payer: Self-pay | Admitting: Adult Health

## 2016-04-30 ENCOUNTER — Non-Acute Institutional Stay (SKILLED_NURSING_FACILITY): Payer: PPO | Admitting: Adult Health

## 2016-04-30 DIAGNOSIS — I10 Essential (primary) hypertension: Secondary | ICD-10-CM

## 2016-04-30 DIAGNOSIS — E46 Unspecified protein-calorie malnutrition: Secondary | ICD-10-CM

## 2016-04-30 DIAGNOSIS — E871 Hypo-osmolality and hyponatremia: Secondary | ICD-10-CM | POA: Diagnosis not present

## 2016-04-30 DIAGNOSIS — E039 Hypothyroidism, unspecified: Secondary | ICD-10-CM

## 2016-04-30 LAB — CBC AND DIFFERENTIAL
HCT: 39 % — AB (ref 41–53)
Hemoglobin: 12.6 g/dL — AB (ref 13.5–17.5)
Neutrophils Absolute: 5 /uL
Platelets: 397 10*3/uL (ref 150–399)
WBC: 8.2 10*3/mL

## 2016-04-30 LAB — HEPATIC FUNCTION PANEL
ALT: 21 U/L (ref 10–40)
AST: 17 U/L (ref 14–40)
Alkaline Phosphatase: 73 U/L (ref 25–125)
Bilirubin, Total: 0.5 mg/dL

## 2016-04-30 LAB — BASIC METABOLIC PANEL
BUN: 19 mg/dL (ref 4–21)
CREATININE: 0.9 mg/dL (ref 0.6–1.3)
GLUCOSE: 106 mg/dL
POTASSIUM: 4.5 mmol/L (ref 3.4–5.3)
SODIUM: 139 mmol/L (ref 137–147)

## 2016-04-30 LAB — TSH: TSH: 14.51 u[IU]/mL — AB (ref 0.41–5.90)
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
## 2016-05-01 DIAGNOSIS — R2681 Unsteadiness on feet: Secondary | ICD-10-CM | POA: Diagnosis not present

## 2016-05-01 DIAGNOSIS — M25561 Pain in right knee: Secondary | ICD-10-CM | POA: Diagnosis not present

## 2016-05-01 DIAGNOSIS — Z5189 Encounter for other specified aftercare: Secondary | ICD-10-CM | POA: Diagnosis not present

## 2016-05-01 DIAGNOSIS — R262 Difficulty in walking, not elsewhere classified: Secondary | ICD-10-CM | POA: Diagnosis not present

## 2016-05-01 DIAGNOSIS — M25661 Stiffness of right knee, not elsewhere classified: Secondary | ICD-10-CM | POA: Diagnosis not present

## 2016-05-01 DIAGNOSIS — R600 Localized edema: Secondary | ICD-10-CM | POA: Diagnosis not present

## 2016-05-01 DIAGNOSIS — M6281 Muscle weakness (generalized): Secondary | ICD-10-CM | POA: Diagnosis not present

## 2016-05-04 DIAGNOSIS — M1711 Unilateral primary osteoarthritis, right knee: Secondary | ICD-10-CM | POA: Diagnosis not present

## 2016-05-05 DIAGNOSIS — M6281 Muscle weakness (generalized): Secondary | ICD-10-CM | POA: Diagnosis not present

## 2016-05-05 DIAGNOSIS — R2681 Unsteadiness on feet: Secondary | ICD-10-CM | POA: Diagnosis not present

## 2016-05-05 DIAGNOSIS — R262 Difficulty in walking, not elsewhere classified: Secondary | ICD-10-CM | POA: Diagnosis not present

## 2016-05-05 DIAGNOSIS — Z5189 Encounter for other specified aftercare: Secondary | ICD-10-CM | POA: Diagnosis not present

## 2016-05-05 DIAGNOSIS — R600 Localized edema: Secondary | ICD-10-CM | POA: Diagnosis not present

## 2016-05-05 DIAGNOSIS — M25661 Stiffness of right knee, not elsewhere classified: Secondary | ICD-10-CM | POA: Diagnosis not present

## 2016-05-05 DIAGNOSIS — M25561 Pain in right knee: Secondary | ICD-10-CM | POA: Diagnosis not present

## 2016-05-08 DIAGNOSIS — M6281 Muscle weakness (generalized): Secondary | ICD-10-CM | POA: Diagnosis not present

## 2016-05-08 DIAGNOSIS — M25561 Pain in right knee: Secondary | ICD-10-CM | POA: Diagnosis not present

## 2016-05-08 DIAGNOSIS — R262 Difficulty in walking, not elsewhere classified: Secondary | ICD-10-CM | POA: Diagnosis not present

## 2016-05-08 DIAGNOSIS — M25661 Stiffness of right knee, not elsewhere classified: Secondary | ICD-10-CM | POA: Diagnosis not present

## 2016-05-08 DIAGNOSIS — K5900 Constipation, unspecified: Secondary | ICD-10-CM | POA: Diagnosis not present

## 2016-05-08 DIAGNOSIS — Z5189 Encounter for other specified aftercare: Secondary | ICD-10-CM | POA: Diagnosis not present

## 2016-05-08 DIAGNOSIS — R600 Localized edema: Secondary | ICD-10-CM | POA: Diagnosis not present

## 2016-05-08 DIAGNOSIS — R2681 Unsteadiness on feet: Secondary | ICD-10-CM | POA: Diagnosis not present

## 2016-05-13 ENCOUNTER — Non-Acute Institutional Stay (SKILLED_NURSING_FACILITY): Payer: PPO | Admitting: Nurse Practitioner

## 2016-05-13 ENCOUNTER — Encounter: Payer: Self-pay | Admitting: Nurse Practitioner

## 2016-05-13 DIAGNOSIS — K5901 Slow transit constipation: Secondary | ICD-10-CM

## 2016-05-13 DIAGNOSIS — M25561 Pain in right knee: Secondary | ICD-10-CM | POA: Diagnosis not present

## 2016-05-13 DIAGNOSIS — Z5189 Encounter for other specified aftercare: Secondary | ICD-10-CM | POA: Diagnosis not present

## 2016-05-13 DIAGNOSIS — G629 Polyneuropathy, unspecified: Secondary | ICD-10-CM | POA: Diagnosis not present

## 2016-05-13 DIAGNOSIS — K5900 Constipation, unspecified: Secondary | ICD-10-CM | POA: Diagnosis not present

## 2016-05-13 DIAGNOSIS — J4520 Mild intermittent asthma, uncomplicated: Secondary | ICD-10-CM

## 2016-05-13 DIAGNOSIS — I10 Essential (primary) hypertension: Secondary | ICD-10-CM | POA: Diagnosis not present

## 2016-05-13 DIAGNOSIS — M25661 Stiffness of right knee, not elsewhere classified: Secondary | ICD-10-CM | POA: Diagnosis not present

## 2016-05-13 DIAGNOSIS — R600 Localized edema: Secondary | ICD-10-CM | POA: Diagnosis not present

## 2016-05-13 DIAGNOSIS — R2681 Unsteadiness on feet: Secondary | ICD-10-CM | POA: Diagnosis not present

## 2016-05-13 DIAGNOSIS — M6281 Muscle weakness (generalized): Secondary | ICD-10-CM | POA: Diagnosis not present

## 2016-05-13 DIAGNOSIS — M1711 Unilateral primary osteoarthritis, right knee: Secondary | ICD-10-CM | POA: Diagnosis not present

## 2016-05-13 DIAGNOSIS — E039 Hypothyroidism, unspecified: Secondary | ICD-10-CM | POA: Diagnosis not present

## 2016-05-13 DIAGNOSIS — R262 Difficulty in walking, not elsewhere classified: Secondary | ICD-10-CM | POA: Diagnosis not present

## 2016-05-13 DIAGNOSIS — G4700 Insomnia, unspecified: Secondary | ICD-10-CM
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
## 2016-05-15 DIAGNOSIS — Z471 Aftercare following joint replacement surgery: Secondary | ICD-10-CM | POA: Diagnosis not present

## 2016-05-15 DIAGNOSIS — Z96651 Presence of right artificial knee joint: Secondary | ICD-10-CM | POA: Diagnosis not present

## 2016-05-15 DIAGNOSIS — G629 Polyneuropathy, unspecified: Secondary | ICD-10-CM | POA: Diagnosis not present

## 2016-05-15 DIAGNOSIS — Z79891 Long term (current) use of opiate analgesic: Secondary | ICD-10-CM | POA: Diagnosis not present

## 2016-05-15 DIAGNOSIS — J45909 Unspecified asthma, uncomplicated: Secondary | ICD-10-CM | POA: Diagnosis not present

## 2016-05-15 DIAGNOSIS — I10 Essential (primary) hypertension: Secondary | ICD-10-CM | POA: Diagnosis not present

## 2016-05-15 DIAGNOSIS — G709 Myoneural disorder, unspecified: Secondary | ICD-10-CM | POA: Diagnosis not present

## 2016-05-15 DIAGNOSIS — E039 Hypothyroidism, unspecified: Secondary | ICD-10-CM | POA: Diagnosis not present

## 2016-05-18 DIAGNOSIS — E039 Hypothyroidism, unspecified: Secondary | ICD-10-CM | POA: Diagnosis not present

## 2016-05-18 DIAGNOSIS — I10 Essential (primary) hypertension: Secondary | ICD-10-CM | POA: Diagnosis not present

## 2016-05-18 DIAGNOSIS — Z471 Aftercare following joint replacement surgery: Secondary | ICD-10-CM | POA: Diagnosis not present

## 2016-05-18 DIAGNOSIS — G709 Myoneural disorder, unspecified: Secondary | ICD-10-CM | POA: Diagnosis not present

## 2016-05-18 DIAGNOSIS — G629 Polyneuropathy, unspecified: Secondary | ICD-10-CM | POA: Diagnosis not present

## 2016-05-18 DIAGNOSIS — Z79891 Long term (current) use of opiate analgesic: Secondary | ICD-10-CM | POA: Diagnosis not present

## 2016-05-18 DIAGNOSIS — Z96651 Presence of right artificial knee joint: Secondary | ICD-10-CM | POA: Diagnosis not present

## 2016-05-18 DIAGNOSIS — J45909 Unspecified asthma, uncomplicated: Secondary | ICD-10-CM | POA: Diagnosis not present

## 2016-05-20 DIAGNOSIS — M1711 Unilateral primary osteoarthritis, right knee: Secondary | ICD-10-CM | POA: Diagnosis not present

## 2016-05-25 DIAGNOSIS — I10 Essential (primary) hypertension: Secondary | ICD-10-CM | POA: Diagnosis not present

## 2016-05-25 DIAGNOSIS — Z79891 Long term (current) use of opiate analgesic: Secondary | ICD-10-CM | POA: Diagnosis not present

## 2016-05-25 DIAGNOSIS — J45909 Unspecified asthma, uncomplicated: Secondary | ICD-10-CM | POA: Diagnosis not present

## 2016-05-25 DIAGNOSIS — E039 Hypothyroidism, unspecified: Secondary | ICD-10-CM | POA: Diagnosis not present

## 2016-05-25 DIAGNOSIS — Z471 Aftercare following joint replacement surgery: Secondary | ICD-10-CM | POA: Diagnosis not present

## 2016-05-25 DIAGNOSIS — G709 Myoneural disorder, unspecified: Secondary | ICD-10-CM | POA: Diagnosis not present

## 2016-05-25 DIAGNOSIS — G629 Polyneuropathy, unspecified: Secondary | ICD-10-CM | POA: Diagnosis not present

## 2016-05-25 DIAGNOSIS — Z96651 Presence of right artificial knee joint: Secondary | ICD-10-CM | POA: Diagnosis not present

## 2016-05-27 DIAGNOSIS — Z471 Aftercare following joint replacement surgery: Secondary | ICD-10-CM | POA: Diagnosis not present

## 2016-05-27 DIAGNOSIS — M25561 Pain in right knee: Secondary | ICD-10-CM | POA: Diagnosis not present

## 2016-05-27 DIAGNOSIS — Z96651 Presence of right artificial knee joint: Secondary | ICD-10-CM | POA: Diagnosis not present

## 2016-05-29 DIAGNOSIS — Z96651 Presence of right artificial knee joint: Secondary | ICD-10-CM | POA: Diagnosis not present

## 2016-05-29 DIAGNOSIS — M25561 Pain in right knee: Secondary | ICD-10-CM | POA: Diagnosis not present

## 2016-05-29 DIAGNOSIS — Z471 Aftercare following joint replacement surgery: Secondary | ICD-10-CM | POA: Diagnosis not present

## 2016-06-01 DIAGNOSIS — Z96651 Presence of right artificial knee joint: Secondary | ICD-10-CM | POA: Diagnosis not present

## 2016-06-01 DIAGNOSIS — Z471 Aftercare following joint replacement surgery: Secondary | ICD-10-CM | POA: Diagnosis not present

## 2016-06-01 DIAGNOSIS — M25561 Pain in right knee: Secondary | ICD-10-CM | POA: Diagnosis not present

## 2016-06-03 DIAGNOSIS — Z471 Aftercare following joint replacement surgery: Secondary | ICD-10-CM | POA: Diagnosis not present

## 2016-06-03 DIAGNOSIS — M25561 Pain in right knee: Secondary | ICD-10-CM | POA: Diagnosis not present

## 2016-06-03 DIAGNOSIS — Z96651 Presence of right artificial knee joint: Secondary | ICD-10-CM | POA: Diagnosis not present

## 2016-06-05 DIAGNOSIS — Z471 Aftercare following joint replacement surgery: Secondary | ICD-10-CM | POA: Diagnosis not present

## 2016-06-05 DIAGNOSIS — Z96651 Presence of right artificial knee joint: Secondary | ICD-10-CM | POA: Diagnosis not present

## 2016-06-05 DIAGNOSIS — M25561 Pain in right knee: Secondary | ICD-10-CM | POA: Diagnosis not present

## 2016-06-08 DIAGNOSIS — Z96651 Presence of right artificial knee joint: Secondary | ICD-10-CM | POA: Diagnosis not present

## 2016-06-08 DIAGNOSIS — M25561 Pain in right knee: Secondary | ICD-10-CM | POA: Diagnosis not present

## 2016-06-08 DIAGNOSIS — Z471 Aftercare following joint replacement surgery: Secondary | ICD-10-CM | POA: Diagnosis not present

## 2016-06-10 DIAGNOSIS — Z96651 Presence of right artificial knee joint: Secondary | ICD-10-CM | POA: Diagnosis not present

## 2016-06-10 DIAGNOSIS — Z471 Aftercare following joint replacement surgery: Secondary | ICD-10-CM | POA: Diagnosis not present

## 2016-06-10 DIAGNOSIS — M25561 Pain in right knee: Secondary | ICD-10-CM | POA: Diagnosis not present

## 2016-06-15 DIAGNOSIS — Z471 Aftercare following joint replacement surgery: Secondary | ICD-10-CM | POA: Diagnosis not present

## 2016-06-15 DIAGNOSIS — M25561 Pain in right knee: Secondary | ICD-10-CM | POA: Diagnosis not present

## 2016-06-15 DIAGNOSIS — Z96651 Presence of right artificial knee joint: Secondary | ICD-10-CM | POA: Diagnosis not present

## 2016-06-17 DIAGNOSIS — M25561 Pain in right knee: Secondary | ICD-10-CM | POA: Diagnosis not present

## 2016-06-17 DIAGNOSIS — Z471 Aftercare following joint replacement surgery: Secondary | ICD-10-CM | POA: Diagnosis not present

## 2016-06-17 DIAGNOSIS — Z96651 Presence of right artificial knee joint: Secondary | ICD-10-CM | POA: Diagnosis not present

## 2016-06-24 DIAGNOSIS — M25561 Pain in right knee: Secondary | ICD-10-CM | POA: Diagnosis not present

## 2016-06-24 DIAGNOSIS — Z96651 Presence of right artificial knee joint: Secondary | ICD-10-CM | POA: Diagnosis not present

## 2016-06-24 DIAGNOSIS — Z471 Aftercare following joint replacement surgery: Secondary | ICD-10-CM | POA: Diagnosis not present

## 2016-06-30 ENCOUNTER — Other Ambulatory Visit: Payer: Self-pay | Admitting: Allergy and Immunology

## 2016-06-30 DIAGNOSIS — Z96651 Presence of right artificial knee joint: Secondary | ICD-10-CM | POA: Diagnosis not present

## 2016-06-30 DIAGNOSIS — Z471 Aftercare following joint replacement surgery: Secondary | ICD-10-CM | POA: Diagnosis not present

## 2016-06-30 DIAGNOSIS — M25561 Pain in right knee: Secondary | ICD-10-CM | POA: Diagnosis not present

## 2016-07-02 DIAGNOSIS — M25561 Pain in right knee: Secondary | ICD-10-CM | POA: Diagnosis not present

## 2016-07-02 DIAGNOSIS — Z471 Aftercare following joint replacement surgery: Secondary | ICD-10-CM | POA: Diagnosis not present

## 2016-07-02 DIAGNOSIS — Z96651 Presence of right artificial knee joint: Secondary | ICD-10-CM | POA: Diagnosis not present

## 2016-07-16 ENCOUNTER — Other Ambulatory Visit: Payer: Self-pay | Admitting: Allergy and Immunology

## 2016-07-16 DIAGNOSIS — Z96651 Presence of right artificial knee joint: Secondary | ICD-10-CM | POA: Diagnosis not present

## 2016-07-16 DIAGNOSIS — Z471 Aftercare following joint replacement surgery: Secondary | ICD-10-CM | POA: Diagnosis not present

## 2016-07-16 DIAGNOSIS — M25561 Pain in right knee: Secondary | ICD-10-CM | POA: Diagnosis not present

## 2016-07-20 DIAGNOSIS — M25562 Pain in left knee: Secondary | ICD-10-CM | POA: Diagnosis not present

## 2016-07-20 DIAGNOSIS — M25561 Pain in right knee: Secondary | ICD-10-CM | POA: Diagnosis not present

## 2016-07-21 DIAGNOSIS — Z96651 Presence of right artificial knee joint: Secondary | ICD-10-CM | POA: Diagnosis not present

## 2016-07-21 DIAGNOSIS — M25561 Pain in right knee: Secondary | ICD-10-CM | POA: Diagnosis not present

## 2016-07-21 DIAGNOSIS — Z471 Aftercare following joint replacement surgery: Secondary | ICD-10-CM | POA: Diagnosis not present

## 2016-07-23 DIAGNOSIS — Z471 Aftercare following joint replacement surgery: Secondary | ICD-10-CM | POA: Diagnosis not present

## 2016-07-23 DIAGNOSIS — M25561 Pain in right knee: Secondary | ICD-10-CM | POA: Diagnosis not present

## 2016-07-23 DIAGNOSIS — Z96651 Presence of right artificial knee joint: Secondary | ICD-10-CM | POA: Diagnosis not present

## 2016-07-24 DIAGNOSIS — N401 Enlarged prostate with lower urinary tract symptoms: Secondary | ICD-10-CM | POA: Diagnosis not present

## 2016-07-24 DIAGNOSIS — Z23 Encounter for immunization: Secondary | ICD-10-CM | POA: Diagnosis not present

## 2016-07-24 DIAGNOSIS — G4733 Obstructive sleep apnea (adult) (pediatric): Secondary | ICD-10-CM | POA: Diagnosis not present

## 2016-07-24 DIAGNOSIS — E7800 Pure hypercholesterolemia, unspecified: Secondary | ICD-10-CM | POA: Diagnosis not present

## 2016-07-24 DIAGNOSIS — K219 Gastro-esophageal reflux disease without esophagitis: Secondary | ICD-10-CM | POA: Diagnosis not present

## 2016-07-24 DIAGNOSIS — R7301 Impaired fasting glucose: Secondary | ICD-10-CM | POA: Diagnosis not present

## 2016-07-24 DIAGNOSIS — E039 Hypothyroidism, unspecified: Secondary | ICD-10-CM | POA: Diagnosis not present

## 2016-07-24 DIAGNOSIS — I10 Essential (primary) hypertension: Secondary | ICD-10-CM | POA: Diagnosis not present

## 2016-07-28 DIAGNOSIS — M25561 Pain in right knee: Secondary | ICD-10-CM | POA: Diagnosis not present

## 2016-07-28 DIAGNOSIS — Z96651 Presence of right artificial knee joint: Secondary | ICD-10-CM | POA: Diagnosis not present

## 2016-07-28 DIAGNOSIS — Z471 Aftercare following joint replacement surgery: Secondary | ICD-10-CM | POA: Diagnosis not present

## 2016-07-30 DIAGNOSIS — Z96651 Presence of right artificial knee joint: Secondary | ICD-10-CM | POA: Diagnosis not present

## 2016-07-30 DIAGNOSIS — M25561 Pain in right knee: Secondary | ICD-10-CM | POA: Diagnosis not present

## 2016-07-30 DIAGNOSIS — Z471 Aftercare following joint replacement surgery: Secondary | ICD-10-CM | POA: Diagnosis not present

## 2016-08-04 DIAGNOSIS — Z471 Aftercare following joint replacement surgery: Secondary | ICD-10-CM | POA: Diagnosis not present

## 2016-08-04 DIAGNOSIS — Z96651 Presence of right artificial knee joint: Secondary | ICD-10-CM | POA: Diagnosis not present

## 2016-08-04 DIAGNOSIS — M25561 Pain in right knee: Secondary | ICD-10-CM | POA: Diagnosis not present

## 2016-08-07 ENCOUNTER — Other Ambulatory Visit: Payer: Self-pay | Admitting: Allergy and Immunology

## 2016-08-08 ENCOUNTER — Other Ambulatory Visit: Payer: Self-pay | Admitting: Allergy and Immunology

## 2016-08-14 ENCOUNTER — Telehealth: Payer: Self-pay | Admitting: Allergy and Immunology

## 2016-08-14 MED ORDER — BECLOMETHASONE DIPROPIONATE 80 MCG/ACT NA AERS: 1.0000 | INHALATION_SPRAY | Freq: Two times a day (BID) | NASAL | 0 refills | Status: DC

## 2016-08-14 NOTE — Telephone Encounter (Signed)
No samples-Qnasl sent to pharmacy. Made pt aware.

## 2016-08-14 NOTE — Telephone Encounter (Signed)
Pt called to make appointment with dr. Neldon Mc , but the earlest is end of January so he made appointment with Dr. Verlin Fester on the January 8 but needs a sample of Qnasl. [phone].

## 2016-08-18 DIAGNOSIS — Z471 Aftercare following joint replacement surgery: Secondary | ICD-10-CM | POA: Diagnosis not present

## 2016-08-18 DIAGNOSIS — M25561 Pain in right knee: Secondary | ICD-10-CM | POA: Diagnosis not present

## 2016-08-18 DIAGNOSIS — Z96651 Presence of right artificial knee joint: Secondary | ICD-10-CM | POA: Diagnosis not present

## 2016-08-20 DIAGNOSIS — Z471 Aftercare following joint replacement surgery: Secondary | ICD-10-CM | POA: Diagnosis not present

## 2016-08-20 DIAGNOSIS — Z96651 Presence of right artificial knee joint: Secondary | ICD-10-CM | POA: Diagnosis not present

## 2016-08-20 DIAGNOSIS — M25561 Pain in right knee: Secondary | ICD-10-CM | POA: Diagnosis not present

## 2016-08-24 ENCOUNTER — Encounter (INDEPENDENT_AMBULATORY_CARE_PROVIDER_SITE_OTHER): Payer: Self-pay

## 2016-08-24 ENCOUNTER — Encounter: Payer: Self-pay | Admitting: Allergy and Immunology

## 2016-08-24 ENCOUNTER — Ambulatory Visit (INDEPENDENT_AMBULATORY_CARE_PROVIDER_SITE_OTHER): Payer: PPO | Admitting: Allergy and Immunology

## 2016-08-24 VITALS — BP 120/74 | HR 72 | Temp 97.8°F | Resp 16 | Ht 70.0 in | Wt 292.0 lb

## 2016-08-24 DIAGNOSIS — K219 Gastro-esophageal reflux disease without esophagitis: Secondary | ICD-10-CM

## 2016-08-24 DIAGNOSIS — J310 Chronic rhinitis: Secondary | ICD-10-CM

## 2016-08-24 DIAGNOSIS — J4540 Moderate persistent asthma, uncomplicated: Secondary | ICD-10-CM | POA: Diagnosis not present

## 2016-08-24 NOTE — Assessment & Plan Note (Signed)
   Continue appropriate lifestyle modifications, Zegerid daily, and ranitidine as needed.

## 2016-08-24 NOTE — Progress Notes (Signed)
Follow-up Note  RE: James Henson MRN: [id number] DOB: 1951-05-16 Date of Office Visit: 08/24/2016  Primary care provider: Irven Shelling, MD Referring provider: Lavone Orn, MD  History of present illness: James Henson is a 66 y.o. male with persistent asthma, chronic rhinitis, and gastroesophageal reflux disease presenting today for follow up.  He was last seen in this clinic in March 2017.  He reports that his asthma has been well-controlled.  He rarely requires albuterol rescue and does not experience nocturnal awakenings due to lower respiratory symptoms.  Assess reflux is well-controlled with Zegerid and occasional ranitidine as needed.  He reports that his nasal symptoms had been well-controlled until October when he began to experience more frequent nasal congestion and rhinorrhea.  He is currently using azelastine nasal spray, Qnasl, and montelukast.   Assessment and plan: Moderate persistent asthma Well-controlled.  For now, continue Dulera 200-5 g, 2 inhalations via spacer device twice a day, montelukast 10 mg daily bedtime, and albuterol HFA, 1-2 inhalations every 4-6 hours as needed.  If subjective and objective measures of pulmonary function remain stable, we will consider stepping down therapy on the next visit.  Chronic rhinitis  Continue azelastine nasal spray and fluticasone nasal spray twice daily.  I have also recommended nasal saline spray (i.e., Simply Saline) or nasal saline lavage (i.e., NeilMed) as needed prior to medicated nasal sprays.  GERD (gastroesophageal reflux disease)  Continue appropriate lifestyle modifications, Zegerid daily, and ranitidine as needed.   Diagnostics: Spirometry reveals an FVC of 3.41 L (80% predicted) and an FEV1 of 2.65 L (70% predicted).  Mild restrictive pattern.  Improved compared with previous study.  Please see scanned spirometry results for details.    Physical examination: Blood pressure 120/74,  pulse 72, temperature 97.8 F (36.6 C), temperature source Oral, resp. rate 16, height 5\' 10"  (1.778 m), weight 292 lb (132.5 kg), SpO2 98 %.  General: Alert, interactive, in no acute distress. HEENT: TMs pearly gray, turbinates moderately edematous without discharge, post-pharynx mildly erythematous. Neck: Supple without lymphadenopathy. Lungs: Mildly decreased breath sounds bilaterally without wheezing, rhonchi or rales. CV: Normal S1, S2 without murmurs. Skin: Warm and dry, without lesions or rashes.  The following portions of the patient's history were reviewed and updated as appropriate: allergies, current medications, past family history, past medical history, past social history, past surgical history and problem list.  Allergies as of 08/24/2016      Reactions   No Known Allergies       Medication List       Accurate as of 08/24/16  5:13 PM. Always use your most recent med list.          azelastine 0.1 % nasal spray Commonly known as:  ASTELIN 1-2 sprays in each nostril 2 times daily   Beclomethasone Dipropionate 80 MCG/ACT Aers Commonly known as:  QNASL Place 1 spray into the nose 2 (two) times daily.   DULERA 200-5 MCG/ACT Aero Generic drug:  mometasone-formoterol INHALE 2 PUFFS INTO THE LUNGS TWICE DAILY.   gabapentin 300 MG capsule Commonly known as:  NEURONTIN Take 300 mg by mouth daily.   HYDROcodone-acetaminophen 5-325 MG tablet Commonly known as:  NORCO/VICODIN Take 1-2 tablets by mouth every 4 (four) hours as needed (breakthrough pain).   lactulose (encephalopathy) 10 GM/15ML Soln Commonly known as:  CHRONULAC Take 30 g by mouth daily.   levothyroxine 200 MCG tablet Commonly known as:  SYNTHROID, LEVOTHROID Take 1 tablet by mouth daily. To be taken with  a 25 mcg tablet to = 225 mcg qd   losartan 100 MG tablet Commonly known as:  COZAAR Take 100 mg by mouth daily.   LYRICA 75 MG capsule Generic drug:  pregabalin TK ONE C PO QAM AND 2 CS QPM     montelukast 10 MG tablet Commonly known as:  SINGULAIR TAKE 1 TABLET BY MOUTH EVERY DAY   omeprazole-sodium bicarbonate 40-1100 MG capsule Commonly known as:  ZEGERID Take 1 capsule by mouth daily before breakfast.   ranitidine 150 MG tablet Commonly known as:  ZANTAC Take 150 mg by mouth as needed for heartburn.   traZODone 50 MG tablet Commonly known as:  DESYREL Take 25 mg by mouth at bedtime. Take 1/2 of a 50 mg tablet to = 25 mg       Allergies  Allergen Reactions  . No Known Allergies     I appreciate the opportunity to take part in Terris's care. Please do not hesitate to contact me with questions.  Sincerely,   R. Edgar Frisk, MD

## 2016-08-24 NOTE — Patient Instructions (Addendum)
Moderate persistent asthma Well-controlled.  For now, continue Dulera 200-5 g, 2 inhalations via spacer device twice a day, montelukast 10 mg daily bedtime, and albuterol HFA, 1-2 inhalations every 4-6 hours as needed.  If subjective and objective measures of pulmonary function remain stable, we will consider stepping down therapy on the next visit.  Chronic rhinitis  Continue azelastine nasal spray and fluticasone nasal spray twice daily.  I have also recommended nasal saline spray (i.e., Simply Saline) or nasal saline lavage (i.e., NeilMed) as needed prior to medicated nasal sprays.  GERD (gastroesophageal reflux disease)  Continue appropriate lifestyle modifications, Zegerid daily, and ranitidine as needed.   Return in about 6 months (around 02/21/2017), or if symptoms worsen or fail to improve.

## 2016-08-24 NOTE — Assessment & Plan Note (Signed)
Well-controlled.  For now, continue Dulera 200-5 g, 2 inhalations via spacer device twice a day, montelukast 10 mg daily bedtime, and albuterol HFA, 1-2 inhalations every 4-6 hours as needed.  If subjective and objective measures of pulmonary function remain stable, we will consider stepping down therapy on the next visit.

## 2016-08-24 NOTE — Assessment & Plan Note (Signed)
   Continue azelastine nasal spray and fluticasone nasal spray twice daily.  I have also recommended nasal saline spray (i.e., Simply Saline) or nasal saline lavage (i.e., NeilMed) as needed prior to medicated nasal sprays.

## 2016-08-25 ENCOUNTER — Telehealth: Payer: Self-pay | Admitting: Allergy and Immunology

## 2016-08-25 DIAGNOSIS — G4733 Obstructive sleep apnea (adult) (pediatric): Secondary | ICD-10-CM | POA: Diagnosis not present

## 2016-08-25 DIAGNOSIS — Z96651 Presence of right artificial knee joint: Secondary | ICD-10-CM | POA: Diagnosis not present

## 2016-08-25 DIAGNOSIS — M25561 Pain in right knee: Secondary | ICD-10-CM | POA: Diagnosis not present

## 2016-08-25 DIAGNOSIS — Z471 Aftercare following joint replacement surgery: Secondary | ICD-10-CM | POA: Diagnosis not present

## 2016-08-25 NOTE — Telephone Encounter (Signed)
Spoke to patient and informed him that PFT was scheduled in error.

## 2016-08-25 NOTE — Telephone Encounter (Signed)
Pt called and said that he got a phone call from [place] about him coming in for a pulmonary function test, and did not understand why you would want him to have one after you said that he was doing good. [phone].

## 2016-08-25 NOTE — Telephone Encounter (Signed)
I do not believe that I ordered a PFT for this patient.  If I did, it was in the past.  However, looking at his most recent visit there is no need for pulmonary function tests to be done.  Please inform the patient and apologize on my behalf for any inconvenience.  Thanks

## 2016-08-25 NOTE — Telephone Encounter (Signed)
Left message to call office

## 2016-08-26 ENCOUNTER — Other Ambulatory Visit: Payer: Self-pay | Admitting: Allergy and Immunology

## 2016-08-27 DIAGNOSIS — M25561 Pain in right knee: Secondary | ICD-10-CM | POA: Diagnosis not present

## 2016-08-27 DIAGNOSIS — Z471 Aftercare following joint replacement surgery: Secondary | ICD-10-CM | POA: Diagnosis not present

## 2016-08-27 DIAGNOSIS — Z96651 Presence of right artificial knee joint: Secondary | ICD-10-CM | POA: Diagnosis not present

## 2016-08-28 ENCOUNTER — Telehealth: Payer: Self-pay

## 2016-08-28 NOTE — Telephone Encounter (Signed)
Patient seen on 08/24/2016 By Dr. Verlin Fester, he would like to speak to a nurse. Patient would not go into detail with me, he stated he would like to explain to a nurse.   Please Advise

## 2016-08-28 NOTE — Telephone Encounter (Signed)
Called patient. States that he used the [place] [facility] Sinus Rinse. Now has water in his ear canal. Hears a bubbling and crackling noise. This has effected his hearing as well. Increased pressure in his head as well. Patient is concerned about ear infection. I advised him that if it became worse over the weekend to go to Urgent Care. Also told him that I would send message to Dr. Verlin Fester and that we would see what he says.    Please advise and thank you.

## 2016-08-31 NOTE — Telephone Encounter (Signed)
Must use gentle pressure when using nasal saline irrigation. Will likely drain and, since sterile saline should not cause an infection.

## 2016-08-31 NOTE — Telephone Encounter (Signed)
Left message for patient with this information and advised if still having issues to call back and we will see if we can get him in to see a doctor that is available.

## 2016-09-01 DIAGNOSIS — M25561 Pain in right knee: Secondary | ICD-10-CM | POA: Diagnosis not present

## 2016-09-01 DIAGNOSIS — Z96651 Presence of right artificial knee joint: Secondary | ICD-10-CM | POA: Diagnosis not present

## 2016-09-01 DIAGNOSIS — Z471 Aftercare following joint replacement surgery: Secondary | ICD-10-CM | POA: Diagnosis not present

## 2016-09-08 ENCOUNTER — Other Ambulatory Visit: Payer: Self-pay | Admitting: Allergy and Immunology

## 2016-09-14 DIAGNOSIS — H353122 Nonexudative age-related macular degeneration, left eye, intermediate dry stage: Secondary | ICD-10-CM | POA: Diagnosis not present

## 2016-09-16 ENCOUNTER — Other Ambulatory Visit: Payer: Self-pay | Admitting: Allergy and Immunology

## 2016-10-09 ENCOUNTER — Other Ambulatory Visit: Payer: Self-pay | Admitting: Allergy and Immunology

## 2016-10-09 DIAGNOSIS — K219 Gastro-esophageal reflux disease without esophagitis: Secondary | ICD-10-CM

## 2016-12-17 DIAGNOSIS — J45909 Unspecified asthma, uncomplicated: Secondary | ICD-10-CM | POA: Diagnosis not present

## 2016-12-17 DIAGNOSIS — G629 Polyneuropathy, unspecified: Secondary | ICD-10-CM | POA: Diagnosis not present

## 2016-12-17 DIAGNOSIS — E669 Obesity, unspecified: Secondary | ICD-10-CM | POA: Diagnosis not present

## 2016-12-17 DIAGNOSIS — G4733 Obstructive sleep apnea (adult) (pediatric): Secondary | ICD-10-CM | POA: Diagnosis not present

## 2016-12-17 DIAGNOSIS — Z1389 Encounter for screening for other disorder: Secondary | ICD-10-CM | POA: Diagnosis not present

## 2016-12-17 DIAGNOSIS — Z6841 Body Mass Index (BMI) 40.0 and over, adult: Secondary | ICD-10-CM | POA: Diagnosis not present

## 2016-12-17 DIAGNOSIS — E039 Hypothyroidism, unspecified: Secondary | ICD-10-CM | POA: Diagnosis not present

## 2016-12-17 DIAGNOSIS — N486 Induration penis plastica: Secondary | ICD-10-CM | POA: Diagnosis not present

## 2016-12-17 DIAGNOSIS — Z0000 Encounter for general adult medical examination without abnormal findings: Secondary | ICD-10-CM | POA: Diagnosis not present

## 2016-12-17 DIAGNOSIS — I10 Essential (primary) hypertension: Secondary | ICD-10-CM | POA: Diagnosis not present

## 2017-01-22 DIAGNOSIS — H16203 Unspecified keratoconjunctivitis, bilateral: Secondary | ICD-10-CM | POA: Diagnosis not present

## 2017-02-16 ENCOUNTER — Ambulatory Visit (INDEPENDENT_AMBULATORY_CARE_PROVIDER_SITE_OTHER): Payer: PPO | Admitting: Urology

## 2017-02-16 ENCOUNTER — Encounter: Payer: Self-pay | Admitting: Urology

## 2017-02-16 VITALS — BP 130/80 | HR 89 | Ht 70.0 in | Wt 305.1 lb

## 2017-02-16 DIAGNOSIS — N486 Induration penis plastica: Secondary | ICD-10-CM

## 2017-02-16 DIAGNOSIS — N5201 Erectile dysfunction due to arterial insufficiency: Secondary | ICD-10-CM
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
## 2017-03-04 DIAGNOSIS — R42 Dizziness and giddiness: Secondary | ICD-10-CM | POA: Diagnosis not present

## 2017-03-04 DIAGNOSIS — H6123 Impacted cerumen, bilateral: Secondary | ICD-10-CM | POA: Diagnosis not present

## 2017-03-10 DIAGNOSIS — M25551 Pain in right hip: Secondary | ICD-10-CM | POA: Diagnosis not present

## 2017-03-30 ENCOUNTER — Ambulatory Visit: Payer: PPO

## 2017-04-19 ENCOUNTER — Other Ambulatory Visit: Payer: Self-pay | Admitting: Allergy and Immunology

## 2017-05-11 DIAGNOSIS — G4733 Obstructive sleep apnea (adult) (pediatric): Secondary | ICD-10-CM | POA: Diagnosis not present

## 2017-05-27 DIAGNOSIS — G4733 Obstructive sleep apnea (adult) (pediatric): Secondary | ICD-10-CM | POA: Diagnosis not present

## 2017-06-04 ENCOUNTER — Other Ambulatory Visit: Payer: Self-pay | Admitting: Allergy and Immunology

## 2017-06-29 DIAGNOSIS — E039 Hypothyroidism, unspecified: Secondary | ICD-10-CM | POA: Diagnosis not present

## 2017-06-29 DIAGNOSIS — J45909 Unspecified asthma, uncomplicated: Secondary | ICD-10-CM | POA: Diagnosis not present

## 2017-06-29 DIAGNOSIS — I10 Essential (primary) hypertension: Secondary | ICD-10-CM | POA: Diagnosis not present

## 2017-06-29 DIAGNOSIS — K219 Gastro-esophageal reflux disease without esophagitis: Secondary | ICD-10-CM | POA: Diagnosis not present

## 2017-06-29 DIAGNOSIS — Z1211 Encounter for screening for malignant neoplasm of colon: Secondary | ICD-10-CM | POA: Diagnosis not present

## 2017-06-29 DIAGNOSIS — G629 Polyneuropathy, unspecified: Secondary | ICD-10-CM | POA: Diagnosis not present

## 2017-06-29 DIAGNOSIS — G4733 Obstructive sleep apnea (adult) (pediatric): Secondary | ICD-10-CM | POA: Diagnosis not present

## 2017-06-29 DIAGNOSIS — R7301 Impaired fasting glucose: Secondary | ICD-10-CM | POA: Diagnosis not present

## 2017-06-29 DIAGNOSIS — Z23 Encounter for immunization: Secondary | ICD-10-CM | POA: Diagnosis not present

## 2017-06-29 DIAGNOSIS — E7800 Pure hypercholesterolemia, unspecified: Secondary | ICD-10-CM | POA: Diagnosis not present

## 2017-07-04 ENCOUNTER — Other Ambulatory Visit: Payer: Self-pay | Admitting: Allergy and Immunology

## 2017-07-06 ENCOUNTER — Other Ambulatory Visit: Payer: Self-pay | Admitting: Allergy and Immunology

## 2017-07-06 MED ORDER — BECLOMETHASONE DIPROPIONATE 80 MCG/ACT NA AERS: INHALATION_SPRAY | NASAL | 0 refills | Status: DC

## 2017-07-06 NOTE — Telephone Encounter (Signed)
Patient is requesting a refill on Qnasl to be called into [facility] on [address]. He said he is going out of town today and would like to pick it up. He realized he needed an appointment and he did make one for December 11.

## 2017-07-16 DIAGNOSIS — J45909 Unspecified asthma, uncomplicated: Secondary | ICD-10-CM | POA: Diagnosis not present

## 2017-07-16 DIAGNOSIS — I10 Essential (primary) hypertension: Secondary | ICD-10-CM | POA: Diagnosis not present

## 2017-07-16 DIAGNOSIS — N401 Enlarged prostate with lower urinary tract symptoms: Secondary | ICD-10-CM | POA: Diagnosis not present

## 2017-07-16 DIAGNOSIS — E039 Hypothyroidism, unspecified: Secondary | ICD-10-CM | POA: Diagnosis not present

## 2017-07-19 DIAGNOSIS — I10 Essential (primary) hypertension: Secondary | ICD-10-CM | POA: Diagnosis not present

## 2017-07-19 DIAGNOSIS — J45909 Unspecified asthma, uncomplicated: Secondary | ICD-10-CM | POA: Diagnosis not present

## 2017-07-19 DIAGNOSIS — N401 Enlarged prostate with lower urinary tract symptoms: Secondary | ICD-10-CM | POA: Diagnosis not present

## 2017-07-19 DIAGNOSIS — E039 Hypothyroidism, unspecified: Secondary | ICD-10-CM | POA: Diagnosis not present

## 2017-07-21 ENCOUNTER — Other Ambulatory Visit: Payer: Self-pay | Admitting: Gastroenterology

## 2017-07-21 DIAGNOSIS — Z1211 Encounter for screening for malignant neoplasm of colon: Secondary | ICD-10-CM

## 2017-07-27 ENCOUNTER — Ambulatory Visit: Payer: PPO | Admitting: Allergy and Immunology

## 2017-07-29 ENCOUNTER — Ambulatory Visit: Payer: PPO

## 2017-07-29 ENCOUNTER — Ambulatory Visit: Payer: PPO | Admitting: Allergy & Immunology

## 2017-07-29 ENCOUNTER — Encounter: Payer: Self-pay | Admitting: Allergy & Immunology

## 2017-07-29 VITALS — BP 118/72 | HR 72 | Resp 18

## 2017-07-29 DIAGNOSIS — K219 Gastro-esophageal reflux disease without esophagitis: Secondary | ICD-10-CM | POA: Diagnosis not present

## 2017-07-29 DIAGNOSIS — J310 Chronic rhinitis: Secondary | ICD-10-CM | POA: Diagnosis not present

## 2017-07-29 DIAGNOSIS — J4541 Moderate persistent asthma with (acute) exacerbation: Secondary | ICD-10-CM | POA: Diagnosis not present

## 2017-07-29 MED ORDER — BECLOMETHASONE DIPROPIONATE 80 MCG/ACT NA AERS: INHALATION_SPRAY | NASAL | 5 refills | Status: DC

## 2017-07-29 MED ORDER — MOMETASONE FURO-FORMOTEROL FUM 200-5 MCG/ACT IN AERO: 2.0000 | INHALATION_SPRAY | Freq: Two times a day (BID) | RESPIRATORY_TRACT | 5 refills | Status: DC

## 2017-07-29 MED ORDER — MONTELUKAST SODIUM 10 MG PO TABS: 10.0000 mg | ORAL_TABLET | Freq: Every day | ORAL | 5 refills | Status: DC
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
## 2017-07-29 NOTE — Patient Instructions (Addendum)
1. Moderate persistent asthma with acute exacerbation - Lung function was markedly decreased compared to normal today, but it did improve with the albuterol treatment. - I think that you likely contracted a viral infection that might have triggered your asthma to flare. - We will give you a sample of Asmanex to use (two puffs twice daily) after your Ruthe Mannan (continue this for two weeks) - Start the steroid pack (prednisone) if you are not getting better over the weekend.  - Daily controller medication(s): Dulera 200/23mcg two puffs twice daily with spacer - Prior to physical activity: ProAir 2 puffs 10-15 minutes before physical activity - Rescue medications: ProAir 4 puffs every 4-6 hours as needed  - Changes during respiratory infections or worsening symptoms: Add on Asmanex 118mcg 2 puffs twice daily for ONE TO TWO WEEKS. - Asthma control goals:  * Full participation in all desired activities (may need albuterol before activity) * Albuterol use two time or less a week on average (not counting use with activity) * Cough interfering with sleep two time or less a month * Oral steroids no more than once a year * No hospitalizations  2. Gastroesophageal reflux disease - Samples of Dexilant provided: take one tablet daily in lieu of the Zegerid. - Continue with the nightly ranitidine. - Let us know if you like the Dexilant and we can try to send that prescription in.   3. Chronic rhinitis - Continue with: Flonase (fluticasone) two sprays per nostril daily and Astelin (azelastine) 2 sprays per nostril 1-2 times daily as needed  4. Return in about 3 months (around 10/27/2017).   Please inform us of any Emergency Department visits, hospitalizations, or changes in symptoms. Call us before going to the ED for breathing or allergy symptoms since we might be able to fit you in for a sick visit. Feel free to contact us anytime with any questions, problems, or concerns.  It was a pleasure to meet you  today! Enjoy the holiday season!  Websites that have reliable patient information: 1. American Academy of Asthma, Allergy, and Immunology: www.aaaai.org 2. Food Allergy Research and Education (FARE): foodallergy.org 3. Mothers of Asthmatics: http://www.asthmacommunitynetwork.org 4. American College of Allergy, Asthma, and Immunology: www.acaai.org

## 2017-08-06 ENCOUNTER — Ambulatory Visit
Admission: RE | Admit: 2017-08-06 | Discharge: 2017-08-06 | Disposition: A | Discharge status: Home / Self Care | Payer: PPO | Source: Ambulatory Visit | Attending: Gastroenterology | Admitting: Gastroenterology

## 2017-08-06 DIAGNOSIS — K5730 Diverticulosis of large intestine without perforation or abscess without bleeding: Secondary | ICD-10-CM | POA: Diagnosis not present

## 2017-08-06 DIAGNOSIS — Z1211 Encounter for screening for malignant neoplasm of colon: Secondary | ICD-10-CM

## 2017-08-06 LAB — HM COLONOSCOPY

## 2017-08-07 ENCOUNTER — Other Ambulatory Visit: Payer: Self-pay | Admitting: Allergy and Immunology

## 2017-08-12 ENCOUNTER — Other Ambulatory Visit: Payer: Self-pay | Admitting: Allergy & Immunology

## 2017-08-12 MED ORDER — DEXLANSOPRAZOLE 60 MG PO CPDR: 60.0000 mg | DELAYED_RELEASE_CAPSULE | Freq: Every day | ORAL | 5 refills | Status: DC

## 2017-08-12 NOTE — Telephone Encounter (Signed)
Patient was seen by Dr Ernst Bowler Was given samples of [place] Patient states its is working really well and is out of samples Can a script be called into [facility] on the [address] Please call patient with any questions

## 2017-08-12 NOTE — Telephone Encounter (Signed)
Unsure of dose Dr. Ernst Bowler provided. Please advise?

## 2017-08-12 NOTE — Telephone Encounter (Signed)
I believe we gave him the 60mg  daily sample. So let's go with that.  Salvatore Marvel, MD [facility]

## 2017-08-12 NOTE — Addendum Note (Signed)
Addended by: Clydene Laming, AMBER L on: 08/12/2017 01:40 PM   Modules accepted: Orders

## 2017-08-12 NOTE — Telephone Encounter (Signed)
Dexilant 60 mg sent to pharmacy 

## 2017-08-31 DIAGNOSIS — E039 Hypothyroidism, unspecified: Secondary | ICD-10-CM | POA: Diagnosis not present

## 2017-09-01 DIAGNOSIS — M67912 Unspecified disorder of synovium and tendon, left shoulder: Secondary | ICD-10-CM | POA: Diagnosis not present

## 2017-09-01 DIAGNOSIS — M25562 Pain in left knee: Secondary | ICD-10-CM | POA: Diagnosis not present

## 2017-09-06 ENCOUNTER — Telehealth: Payer: Self-pay

## 2017-09-06 ENCOUNTER — Other Ambulatory Visit: Payer: Self-pay

## 2017-09-06 MED ORDER — MOMETASONE FUROATE 100 MCG/ACT IN AERO: 2.0000 | INHALATION_SPRAY | Freq: Two times a day (BID) | RESPIRATORY_TRACT | 0 refills | Status: DC

## 2017-09-06 NOTE — Telephone Encounter (Signed)
Patient stated Dr. Ernst Bowler gave him a sample of Asmanex and he would like a prescription sent to [facility] on [address]   Please Advise

## 2017-09-06 NOTE — Telephone Encounter (Signed)
Called to inform patient that I have sent in Asmatex to the pharmacy he requesting, however his mailbox was full so was unable to leave message.

## 2017-09-07 NOTE — Telephone Encounter (Signed)
Left message to advise pt  

## 2017-09-16 DIAGNOSIS — Q438 Other specified congenital malformations of intestine: Secondary | ICD-10-CM | POA: Diagnosis not present

## 2017-09-16 DIAGNOSIS — Z800 Family history of malignant neoplasm of digestive organs: Secondary | ICD-10-CM | POA: Diagnosis not present

## 2017-09-16 DIAGNOSIS — K5730 Diverticulosis of large intestine without perforation or abscess without bleeding: Secondary | ICD-10-CM | POA: Diagnosis not present

## 2017-09-16 DIAGNOSIS — D126 Benign neoplasm of colon, unspecified: Secondary | ICD-10-CM | POA: Diagnosis not present

## 2017-09-16 DIAGNOSIS — Z1211 Encounter for screening for malignant neoplasm of colon: Secondary | ICD-10-CM | POA: Diagnosis not present

## 2017-09-16 DIAGNOSIS — K635 Polyp of colon: Secondary | ICD-10-CM | POA: Diagnosis not present

## 2017-09-21 DIAGNOSIS — Z1211 Encounter for screening for malignant neoplasm of colon: Secondary | ICD-10-CM | POA: Diagnosis not present

## 2017-09-21 DIAGNOSIS — K635 Polyp of colon: Secondary | ICD-10-CM | POA: Diagnosis not present

## 2017-09-21 DIAGNOSIS — D126 Benign neoplasm of colon, unspecified: Secondary | ICD-10-CM | POA: Diagnosis not present

## 2017-10-08 DIAGNOSIS — Z026 Encounter for examination for insurance purposes: Secondary | ICD-10-CM | POA: Diagnosis not present

## 2017-10-08 DIAGNOSIS — R21 Rash and other nonspecific skin eruption: Secondary | ICD-10-CM | POA: Diagnosis not present

## 2017-10-25 ENCOUNTER — Other Ambulatory Visit: Payer: Self-pay | Admitting: Allergy and Immunology

## 2017-10-25 ENCOUNTER — Other Ambulatory Visit: Payer: Self-pay | Admitting: Allergy & Immunology

## 2017-10-25 DIAGNOSIS — K219 Gastro-esophageal reflux disease without esophagitis: Secondary | ICD-10-CM

## 2017-10-25 NOTE — Telephone Encounter (Signed)
Patient has been advised that the prescription has been sent.

## 2017-10-25 NOTE — Telephone Encounter (Signed)
Patient needs a refill ranitidine sent to the [facility] on [address] Patient was without medication all weekend, and would like the refill sent in today Please call patient with any questions

## 2017-10-25 NOTE — Telephone Encounter (Signed)
Prescriptions were sent today. 

## 2017-11-08 DIAGNOSIS — G4733 Obstructive sleep apnea (adult) (pediatric): Secondary | ICD-10-CM | POA: Diagnosis not present

## 2017-11-11 DIAGNOSIS — I10 Essential (primary) hypertension: Secondary | ICD-10-CM | POA: Diagnosis not present

## 2017-11-11 DIAGNOSIS — J45909 Unspecified asthma, uncomplicated: Secondary | ICD-10-CM | POA: Diagnosis not present

## 2017-11-11 DIAGNOSIS — E039 Hypothyroidism, unspecified: Secondary | ICD-10-CM | POA: Diagnosis not present

## 2017-11-11 DIAGNOSIS — N401 Enlarged prostate with lower urinary tract symptoms: Secondary | ICD-10-CM | POA: Diagnosis not present

## 2018-01-05 ENCOUNTER — Other Ambulatory Visit: Payer: Self-pay | Admitting: Allergy and Immunology

## 2018-01-05 ENCOUNTER — Other Ambulatory Visit: Payer: Self-pay | Admitting: Allergy & Immunology

## 2018-01-28 DIAGNOSIS — I10 Essential (primary) hypertension: Secondary | ICD-10-CM | POA: Diagnosis not present

## 2018-01-28 DIAGNOSIS — N401 Enlarged prostate with lower urinary tract symptoms: Secondary | ICD-10-CM | POA: Diagnosis not present

## 2018-01-28 DIAGNOSIS — E039 Hypothyroidism, unspecified: Secondary | ICD-10-CM | POA: Diagnosis not present

## 2018-01-28 DIAGNOSIS — J45909 Unspecified asthma, uncomplicated: Secondary | ICD-10-CM | POA: Diagnosis not present

## 2018-02-01 DIAGNOSIS — J45909 Unspecified asthma, uncomplicated: Secondary | ICD-10-CM | POA: Diagnosis not present

## 2018-02-01 DIAGNOSIS — Z0000 Encounter for general adult medical examination without abnormal findings: Secondary | ICD-10-CM | POA: Diagnosis not present

## 2018-02-01 DIAGNOSIS — E7800 Pure hypercholesterolemia, unspecified: Secondary | ICD-10-CM | POA: Diagnosis not present

## 2018-02-01 DIAGNOSIS — Z6841 Body Mass Index (BMI) 40.0 and over, adult: Secondary | ICD-10-CM | POA: Diagnosis not present

## 2018-02-01 DIAGNOSIS — R7301 Impaired fasting glucose: Secondary | ICD-10-CM | POA: Diagnosis not present

## 2018-02-01 DIAGNOSIS — Z1389 Encounter for screening for other disorder: Secondary | ICD-10-CM | POA: Diagnosis not present

## 2018-02-01 DIAGNOSIS — G629 Polyneuropathy, unspecified: Secondary | ICD-10-CM | POA: Diagnosis not present

## 2018-02-01 DIAGNOSIS — Z125 Encounter for screening for malignant neoplasm of prostate: Secondary | ICD-10-CM | POA: Diagnosis not present

## 2018-02-01 DIAGNOSIS — G4733 Obstructive sleep apnea (adult) (pediatric): Secondary | ICD-10-CM | POA: Diagnosis not present

## 2018-02-01 DIAGNOSIS — I10 Essential (primary) hypertension: Secondary | ICD-10-CM | POA: Diagnosis not present

## 2018-02-01 DIAGNOSIS — E039 Hypothyroidism, unspecified: Secondary | ICD-10-CM | POA: Diagnosis not present

## 2018-02-08 ENCOUNTER — Other Ambulatory Visit: Payer: Self-pay | Admitting: Allergy & Immunology

## 2018-02-09 DIAGNOSIS — G4733 Obstructive sleep apnea (adult) (pediatric): Secondary | ICD-10-CM | POA: Diagnosis not present

## 2018-03-02 ENCOUNTER — Other Ambulatory Visit: Payer: Self-pay | Admitting: Allergy & Immunology

## 2018-04-06 DIAGNOSIS — M25551 Pain in right hip: Secondary | ICD-10-CM | POA: Diagnosis not present

## 2018-04-07 ENCOUNTER — Ambulatory Visit (INDEPENDENT_AMBULATORY_CARE_PROVIDER_SITE_OTHER): Payer: PPO

## 2018-04-07 ENCOUNTER — Ambulatory Visit: Payer: PPO

## 2018-04-07 ENCOUNTER — Other Ambulatory Visit: Payer: Self-pay | Admitting: Podiatry

## 2018-04-07 ENCOUNTER — Ambulatory Visit: Payer: PPO | Admitting: Podiatry

## 2018-04-07 ENCOUNTER — Encounter: Payer: Self-pay | Admitting: Podiatry

## 2018-04-07 VITALS — BP 151/82 | HR 77 | Resp 16

## 2018-04-07 DIAGNOSIS — Q828 Other specified congenital malformations of skin: Secondary | ICD-10-CM | POA: Diagnosis not present

## 2018-04-07 DIAGNOSIS — M7752 Other enthesopathy of left foot: Secondary | ICD-10-CM

## 2018-04-07 DIAGNOSIS — M778 Other enthesopathies, not elsewhere classified: Secondary | ICD-10-CM

## 2018-04-07 DIAGNOSIS — M779 Enthesopathy, unspecified: Secondary | ICD-10-CM
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
## 2018-04-12 ENCOUNTER — Other Ambulatory Visit: Payer: Self-pay | Admitting: Allergy and Immunology

## 2018-05-19 ENCOUNTER — Ambulatory Visit: Payer: PPO | Admitting: Podiatry

## 2018-05-30 ENCOUNTER — Other Ambulatory Visit: Payer: Self-pay | Admitting: Allergy & Immunology

## 2018-07-18 DIAGNOSIS — M25551 Pain in right hip: Secondary | ICD-10-CM | POA: Diagnosis not present

## 2018-07-19 DIAGNOSIS — I10 Essential (primary) hypertension: Secondary | ICD-10-CM | POA: Diagnosis not present

## 2018-07-19 DIAGNOSIS — G629 Polyneuropathy, unspecified: Secondary | ICD-10-CM | POA: Diagnosis not present

## 2018-07-19 DIAGNOSIS — J45909 Unspecified asthma, uncomplicated: Secondary | ICD-10-CM | POA: Diagnosis not present

## 2018-07-19 DIAGNOSIS — K219 Gastro-esophageal reflux disease without esophagitis: Secondary | ICD-10-CM | POA: Diagnosis not present

## 2018-07-19 DIAGNOSIS — Z23 Encounter for immunization: Secondary | ICD-10-CM | POA: Diagnosis not present

## 2018-07-19 DIAGNOSIS — M5441 Lumbago with sciatica, right side: Secondary | ICD-10-CM | POA: Diagnosis not present

## 2018-07-22 DIAGNOSIS — Z6841 Body Mass Index (BMI) 40.0 and over, adult: Secondary | ICD-10-CM | POA: Diagnosis not present

## 2018-07-22 DIAGNOSIS — M533 Sacrococcygeal disorders, not elsewhere classified: Secondary | ICD-10-CM | POA: Diagnosis not present

## 2018-07-26 DIAGNOSIS — M533 Sacrococcygeal disorders, not elsewhere classified: Secondary | ICD-10-CM | POA: Diagnosis not present

## 2018-07-29 DIAGNOSIS — M9902 Segmental and somatic dysfunction of thoracic region: Secondary | ICD-10-CM | POA: Diagnosis not present

## 2018-07-29 DIAGNOSIS — M5417 Radiculopathy, lumbosacral region: Secondary | ICD-10-CM | POA: Diagnosis not present

## 2018-07-29 DIAGNOSIS — M5137 Other intervertebral disc degeneration, lumbosacral region: Secondary | ICD-10-CM | POA: Diagnosis not present

## 2018-07-29 DIAGNOSIS — M9905 Segmental and somatic dysfunction of pelvic region: Secondary | ICD-10-CM | POA: Diagnosis not present

## 2018-07-29 DIAGNOSIS — M9903 Segmental and somatic dysfunction of lumbar region: Secondary | ICD-10-CM | POA: Diagnosis not present

## 2018-07-29 DIAGNOSIS — M5414 Radiculopathy, thoracic region: Secondary | ICD-10-CM | POA: Diagnosis not present

## 2018-08-01 DIAGNOSIS — M5414 Radiculopathy, thoracic region: Secondary | ICD-10-CM | POA: Diagnosis not present

## 2018-08-01 DIAGNOSIS — M5137 Other intervertebral disc degeneration, lumbosacral region: Secondary | ICD-10-CM | POA: Diagnosis not present

## 2018-08-01 DIAGNOSIS — M9905 Segmental and somatic dysfunction of pelvic region: Secondary | ICD-10-CM | POA: Diagnosis not present

## 2018-08-01 DIAGNOSIS — M5417 Radiculopathy, lumbosacral region: Secondary | ICD-10-CM | POA: Diagnosis not present

## 2018-08-01 DIAGNOSIS — M9902 Segmental and somatic dysfunction of thoracic region: Secondary | ICD-10-CM | POA: Diagnosis not present

## 2018-08-01 DIAGNOSIS — M9903 Segmental and somatic dysfunction of lumbar region: Secondary | ICD-10-CM | POA: Diagnosis not present

## 2018-08-03 DIAGNOSIS — M9905 Segmental and somatic dysfunction of pelvic region: Secondary | ICD-10-CM | POA: Diagnosis not present

## 2018-08-03 DIAGNOSIS — M9903 Segmental and somatic dysfunction of lumbar region: Secondary | ICD-10-CM | POA: Diagnosis not present

## 2018-08-03 DIAGNOSIS — M5414 Radiculopathy, thoracic region: Secondary | ICD-10-CM | POA: Diagnosis not present

## 2018-08-03 DIAGNOSIS — M5137 Other intervertebral disc degeneration, lumbosacral region: Secondary | ICD-10-CM | POA: Diagnosis not present

## 2018-08-03 DIAGNOSIS — M9902 Segmental and somatic dysfunction of thoracic region: Secondary | ICD-10-CM | POA: Diagnosis not present

## 2018-08-03 DIAGNOSIS — M5417 Radiculopathy, lumbosacral region: Secondary | ICD-10-CM | POA: Diagnosis not present

## 2018-08-05 DIAGNOSIS — M9903 Segmental and somatic dysfunction of lumbar region: Secondary | ICD-10-CM | POA: Diagnosis not present

## 2018-08-05 DIAGNOSIS — M5417 Radiculopathy, lumbosacral region: Secondary | ICD-10-CM | POA: Diagnosis not present

## 2018-08-05 DIAGNOSIS — M9905 Segmental and somatic dysfunction of pelvic region: Secondary | ICD-10-CM | POA: Diagnosis not present

## 2018-08-05 DIAGNOSIS — M5137 Other intervertebral disc degeneration, lumbosacral region: Secondary | ICD-10-CM | POA: Diagnosis not present

## 2018-08-05 DIAGNOSIS — M9902 Segmental and somatic dysfunction of thoracic region: Secondary | ICD-10-CM | POA: Diagnosis not present

## 2018-08-05 DIAGNOSIS — M5414 Radiculopathy, thoracic region: Secondary | ICD-10-CM | POA: Diagnosis not present

## 2018-08-08 ENCOUNTER — Other Ambulatory Visit: Payer: Self-pay | Admitting: Physical Medicine and Rehabilitation

## 2018-08-08 DIAGNOSIS — M5414 Radiculopathy, thoracic region: Secondary | ICD-10-CM | POA: Diagnosis not present

## 2018-08-08 DIAGNOSIS — M9903 Segmental and somatic dysfunction of lumbar region: Secondary | ICD-10-CM | POA: Diagnosis not present

## 2018-08-08 DIAGNOSIS — M9902 Segmental and somatic dysfunction of thoracic region: Secondary | ICD-10-CM | POA: Diagnosis not present

## 2018-08-08 DIAGNOSIS — M533 Sacrococcygeal disorders, not elsewhere classified: Secondary | ICD-10-CM

## 2018-08-08 DIAGNOSIS — M9905 Segmental and somatic dysfunction of pelvic region: Secondary | ICD-10-CM | POA: Diagnosis not present

## 2018-08-08 DIAGNOSIS — M5417 Radiculopathy, lumbosacral region: Secondary | ICD-10-CM | POA: Diagnosis not present

## 2018-08-08 DIAGNOSIS — M25551 Pain in right hip: Secondary | ICD-10-CM

## 2018-08-08 DIAGNOSIS — M5137 Other intervertebral disc degeneration, lumbosacral region: Secondary | ICD-10-CM | POA: Diagnosis not present

## 2018-08-11 ENCOUNTER — Ambulatory Visit
Admission: RE | Admit: 2018-08-11 | Discharge: 2018-08-11 | Disposition: A | Discharge status: Home / Self Care | Payer: PPO | Source: Ambulatory Visit | Attending: Physical Medicine and Rehabilitation | Admitting: Physical Medicine and Rehabilitation

## 2018-08-11 DIAGNOSIS — M25551 Pain in right hip: Secondary | ICD-10-CM

## 2018-08-11 DIAGNOSIS — M160 Bilateral primary osteoarthritis of hip: Secondary | ICD-10-CM | POA: Diagnosis not present

## 2018-08-11 DIAGNOSIS — M9902 Segmental and somatic dysfunction of thoracic region: Secondary | ICD-10-CM | POA: Diagnosis not present

## 2018-08-11 DIAGNOSIS — M9903 Segmental and somatic dysfunction of lumbar region: Secondary | ICD-10-CM | POA: Diagnosis not present

## 2018-08-11 DIAGNOSIS — M9905 Segmental and somatic dysfunction of pelvic region: Secondary | ICD-10-CM | POA: Diagnosis not present

## 2018-08-11 DIAGNOSIS — M5137 Other intervertebral disc degeneration, lumbosacral region: Secondary | ICD-10-CM | POA: Diagnosis not present

## 2018-08-11 DIAGNOSIS — M5417 Radiculopathy, lumbosacral region: Secondary | ICD-10-CM | POA: Diagnosis not present

## 2018-08-11 DIAGNOSIS — M5414 Radiculopathy, thoracic region: Secondary | ICD-10-CM | POA: Diagnosis not present

## 2018-08-12 ENCOUNTER — Ambulatory Visit
Admission: RE | Admit: 2018-08-12 | Discharge: 2018-08-12 | Disposition: A | Discharge status: Home / Self Care | Payer: PPO | Source: Ambulatory Visit | Attending: Physical Medicine and Rehabilitation | Admitting: Physical Medicine and Rehabilitation

## 2018-08-12 DIAGNOSIS — M5126 Other intervertebral disc displacement, lumbar region: Secondary | ICD-10-CM | POA: Diagnosis not present

## 2018-08-12 DIAGNOSIS — M533 Sacrococcygeal disorders, not elsewhere classified: Secondary | ICD-10-CM

## 2018-08-12 DIAGNOSIS — M47816 Spondylosis without myelopathy or radiculopathy, lumbar region: Secondary | ICD-10-CM | POA: Diagnosis not present

## 2018-08-14 ENCOUNTER — Other Ambulatory Visit: Payer: PPO

## 2018-08-15 DIAGNOSIS — I10 Essential (primary) hypertension: Secondary | ICD-10-CM | POA: Diagnosis not present

## 2018-08-15 DIAGNOSIS — N401 Enlarged prostate with lower urinary tract symptoms: Secondary | ICD-10-CM | POA: Diagnosis not present

## 2018-08-15 DIAGNOSIS — J45909 Unspecified asthma, uncomplicated: Secondary | ICD-10-CM | POA: Diagnosis not present

## 2018-08-15 DIAGNOSIS — E039 Hypothyroidism, unspecified: Secondary | ICD-10-CM | POA: Diagnosis not present

## 2018-08-16 DIAGNOSIS — M5137 Other intervertebral disc degeneration, lumbosacral region: Secondary | ICD-10-CM | POA: Diagnosis not present

## 2018-08-16 DIAGNOSIS — M9905 Segmental and somatic dysfunction of pelvic region: Secondary | ICD-10-CM | POA: Diagnosis not present

## 2018-08-16 DIAGNOSIS — M9902 Segmental and somatic dysfunction of thoracic region: Secondary | ICD-10-CM | POA: Diagnosis not present

## 2018-08-16 DIAGNOSIS — M5417 Radiculopathy, lumbosacral region: Secondary | ICD-10-CM | POA: Diagnosis not present

## 2018-08-16 DIAGNOSIS — M5414 Radiculopathy, thoracic region: Secondary | ICD-10-CM | POA: Diagnosis not present

## 2018-08-16 DIAGNOSIS — M9903 Segmental and somatic dysfunction of lumbar region: Secondary | ICD-10-CM | POA: Diagnosis not present

## 2018-08-19 DIAGNOSIS — M5414 Radiculopathy, thoracic region: Secondary | ICD-10-CM | POA: Diagnosis not present

## 2018-08-19 DIAGNOSIS — M9905 Segmental and somatic dysfunction of pelvic region: Secondary | ICD-10-CM | POA: Diagnosis not present

## 2018-08-19 DIAGNOSIS — M5417 Radiculopathy, lumbosacral region: Secondary | ICD-10-CM | POA: Diagnosis not present

## 2018-08-19 DIAGNOSIS — M9902 Segmental and somatic dysfunction of thoracic region: Secondary | ICD-10-CM | POA: Diagnosis not present

## 2018-08-19 DIAGNOSIS — M5137 Other intervertebral disc degeneration, lumbosacral region: Secondary | ICD-10-CM | POA: Diagnosis not present

## 2018-08-19 DIAGNOSIS — M533 Sacrococcygeal disorders, not elsewhere classified: Secondary | ICD-10-CM | POA: Diagnosis not present

## 2018-08-19 DIAGNOSIS — M9903 Segmental and somatic dysfunction of lumbar region: Secondary | ICD-10-CM | POA: Diagnosis not present

## 2018-08-22 DIAGNOSIS — M479 Spondylosis, unspecified: Secondary | ICD-10-CM | POA: Diagnosis not present

## 2018-08-24 DIAGNOSIS — M479 Spondylosis, unspecified: Secondary | ICD-10-CM | POA: Diagnosis not present

## 2018-08-24 DIAGNOSIS — M5417 Radiculopathy, lumbosacral region: Secondary | ICD-10-CM | POA: Diagnosis not present

## 2018-08-24 DIAGNOSIS — M9902 Segmental and somatic dysfunction of thoracic region: Secondary | ICD-10-CM | POA: Diagnosis not present

## 2018-08-24 DIAGNOSIS — M9905 Segmental and somatic dysfunction of pelvic region: Secondary | ICD-10-CM | POA: Diagnosis not present

## 2018-08-24 DIAGNOSIS — M5137 Other intervertebral disc degeneration, lumbosacral region: Secondary | ICD-10-CM | POA: Diagnosis not present

## 2018-08-24 DIAGNOSIS — M9903 Segmental and somatic dysfunction of lumbar region: Secondary | ICD-10-CM | POA: Diagnosis not present

## 2018-08-24 DIAGNOSIS — M5414 Radiculopathy, thoracic region: Secondary | ICD-10-CM | POA: Diagnosis not present

## 2018-08-26 DIAGNOSIS — M9902 Segmental and somatic dysfunction of thoracic region: Secondary | ICD-10-CM | POA: Diagnosis not present

## 2018-08-26 DIAGNOSIS — M9905 Segmental and somatic dysfunction of pelvic region: Secondary | ICD-10-CM | POA: Diagnosis not present

## 2018-08-26 DIAGNOSIS — M5137 Other intervertebral disc degeneration, lumbosacral region: Secondary | ICD-10-CM | POA: Diagnosis not present

## 2018-08-26 DIAGNOSIS — M5414 Radiculopathy, thoracic region: Secondary | ICD-10-CM | POA: Diagnosis not present

## 2018-08-26 DIAGNOSIS — M5417 Radiculopathy, lumbosacral region: Secondary | ICD-10-CM | POA: Diagnosis not present

## 2018-08-26 DIAGNOSIS — M9903 Segmental and somatic dysfunction of lumbar region: Secondary | ICD-10-CM | POA: Diagnosis not present

## 2018-08-29 DIAGNOSIS — M9902 Segmental and somatic dysfunction of thoracic region: Secondary | ICD-10-CM | POA: Diagnosis not present

## 2018-08-29 DIAGNOSIS — M5137 Other intervertebral disc degeneration, lumbosacral region: Secondary | ICD-10-CM | POA: Diagnosis not present

## 2018-08-29 DIAGNOSIS — M9905 Segmental and somatic dysfunction of pelvic region: Secondary | ICD-10-CM | POA: Diagnosis not present

## 2018-08-29 DIAGNOSIS — M5414 Radiculopathy, thoracic region: Secondary | ICD-10-CM | POA: Diagnosis not present

## 2018-08-29 DIAGNOSIS — M5417 Radiculopathy, lumbosacral region: Secondary | ICD-10-CM | POA: Diagnosis not present

## 2018-08-29 DIAGNOSIS — M9903 Segmental and somatic dysfunction of lumbar region: Secondary | ICD-10-CM | POA: Diagnosis not present

## 2018-08-30 DIAGNOSIS — M479 Spondylosis, unspecified: Secondary | ICD-10-CM | POA: Diagnosis not present

## 2018-08-31 DIAGNOSIS — M5137 Other intervertebral disc degeneration, lumbosacral region: Secondary | ICD-10-CM | POA: Diagnosis not present

## 2018-08-31 DIAGNOSIS — M5417 Radiculopathy, lumbosacral region: Secondary | ICD-10-CM | POA: Diagnosis not present

## 2018-08-31 DIAGNOSIS — M5414 Radiculopathy, thoracic region: Secondary | ICD-10-CM | POA: Diagnosis not present

## 2018-08-31 DIAGNOSIS — M9903 Segmental and somatic dysfunction of lumbar region: Secondary | ICD-10-CM | POA: Diagnosis not present

## 2018-08-31 DIAGNOSIS — M9902 Segmental and somatic dysfunction of thoracic region: Secondary | ICD-10-CM | POA: Diagnosis not present

## 2018-08-31 DIAGNOSIS — G4733 Obstructive sleep apnea (adult) (pediatric): Secondary | ICD-10-CM | POA: Diagnosis not present

## 2018-08-31 DIAGNOSIS — M9905 Segmental and somatic dysfunction of pelvic region: Secondary | ICD-10-CM | POA: Diagnosis not present

## 2018-09-01 DIAGNOSIS — M479 Spondylosis, unspecified: Secondary | ICD-10-CM | POA: Diagnosis not present

## 2018-09-05 DIAGNOSIS — M9903 Segmental and somatic dysfunction of lumbar region: Secondary | ICD-10-CM | POA: Diagnosis not present

## 2018-09-05 DIAGNOSIS — M9902 Segmental and somatic dysfunction of thoracic region: Secondary | ICD-10-CM | POA: Diagnosis not present

## 2018-09-05 DIAGNOSIS — M5417 Radiculopathy, lumbosacral region: Secondary | ICD-10-CM | POA: Diagnosis not present

## 2018-09-05 DIAGNOSIS — M5137 Other intervertebral disc degeneration, lumbosacral region: Secondary | ICD-10-CM | POA: Diagnosis not present

## 2018-09-05 DIAGNOSIS — M5414 Radiculopathy, thoracic region: Secondary | ICD-10-CM | POA: Diagnosis not present

## 2018-09-05 DIAGNOSIS — M9905 Segmental and somatic dysfunction of pelvic region: Secondary | ICD-10-CM | POA: Diagnosis not present

## 2018-09-07 DIAGNOSIS — I10 Essential (primary) hypertension: Secondary | ICD-10-CM | POA: Diagnosis not present

## 2018-09-07 DIAGNOSIS — J45909 Unspecified asthma, uncomplicated: Secondary | ICD-10-CM | POA: Diagnosis not present

## 2018-09-07 DIAGNOSIS — N401 Enlarged prostate with lower urinary tract symptoms: Secondary | ICD-10-CM | POA: Diagnosis not present

## 2018-09-07 DIAGNOSIS — E039 Hypothyroidism, unspecified: Secondary | ICD-10-CM | POA: Diagnosis not present

## 2018-09-07 DIAGNOSIS — M479 Spondylosis, unspecified: Secondary | ICD-10-CM | POA: Diagnosis not present

## 2018-09-14 DIAGNOSIS — M479 Spondylosis, unspecified: Secondary | ICD-10-CM | POA: Diagnosis not present

## 2018-09-16 DIAGNOSIS — M479 Spondylosis, unspecified: Secondary | ICD-10-CM | POA: Diagnosis not present

## 2018-09-20 DIAGNOSIS — M479 Spondylosis, unspecified: Secondary | ICD-10-CM | POA: Diagnosis not present

## 2018-09-27 ENCOUNTER — Encounter (INDEPENDENT_AMBULATORY_CARE_PROVIDER_SITE_OTHER): Payer: PPO

## 2018-09-28 DIAGNOSIS — M479 Spondylosis, unspecified: Secondary | ICD-10-CM | POA: Diagnosis not present

## 2018-10-03 ENCOUNTER — Encounter (INDEPENDENT_AMBULATORY_CARE_PROVIDER_SITE_OTHER): Payer: Self-pay | Admitting: Bariatrics

## 2018-10-03 ENCOUNTER — Ambulatory Visit (INDEPENDENT_AMBULATORY_CARE_PROVIDER_SITE_OTHER): Payer: PPO | Admitting: Bariatrics

## 2018-10-03 VITALS — BP 134/75 | HR 68 | Temp 98.2°F | Ht 72.0 in | Wt 321.0 lb

## 2018-10-03 DIAGNOSIS — Z6841 Body Mass Index (BMI) 40.0 and over, adult: Secondary | ICD-10-CM

## 2018-10-03 DIAGNOSIS — R5383 Other fatigue: Secondary | ICD-10-CM | POA: Diagnosis not present

## 2018-10-03 DIAGNOSIS — K219 Gastro-esophageal reflux disease without esophagitis: Secondary | ICD-10-CM

## 2018-10-03 DIAGNOSIS — I10 Essential (primary) hypertension: Secondary | ICD-10-CM

## 2018-10-03 DIAGNOSIS — E7849 Other hyperlipidemia: Secondary | ICD-10-CM | POA: Diagnosis not present

## 2018-10-03 DIAGNOSIS — G4733 Obstructive sleep apnea (adult) (pediatric): Secondary | ICD-10-CM

## 2018-10-03 DIAGNOSIS — F3289 Other specified depressive episodes: Secondary | ICD-10-CM

## 2018-10-03 DIAGNOSIS — Z0289 Encounter for other administrative examinations: Secondary | ICD-10-CM

## 2018-10-03 DIAGNOSIS — E559 Vitamin D deficiency, unspecified: Secondary | ICD-10-CM | POA: Diagnosis not present

## 2018-10-03 DIAGNOSIS — R0602 Shortness of breath: Secondary | ICD-10-CM | POA: Diagnosis not present

## 2018-10-03 DIAGNOSIS — Z9189 Other specified personal risk factors, not elsewhere classified: Secondary | ICD-10-CM

## 2018-10-03 DIAGNOSIS — R7309 Other abnormal glucose: Secondary | ICD-10-CM | POA: Diagnosis not present

## 2018-10-03 DIAGNOSIS — E66813 Obesity, class 3: Secondary | ICD-10-CM
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
## 2018-10-04 DIAGNOSIS — Z6841 Body Mass Index (BMI) 40.0 and over, adult: Secondary | ICD-10-CM

## 2018-10-04 LAB — LIPID PANEL WITH LDL/HDL RATIO
Cholesterol, Total: 144 mg/dL (ref 100–199)
HDL: 40 mg/dL (ref >39)
LDL Calculated: 70 mg/dL (ref 0–99)
LDl/HDL Ratio: 1.8 ratio (ref 0.0–3.6)
Triglycerides: 168 mg/dL — ABNORMAL HIGH (ref 0–149)
VLDL Cholesterol Cal: 34 mg/dL (ref 5–40)

## 2018-10-04 LAB — COMPREHENSIVE METABOLIC PANEL
ALT: 53 IU/L — ABNORMAL HIGH (ref 0–44)
AST: 37 IU/L (ref 0–40)
Albumin/Globulin Ratio: 1.7 (ref 1.2–2.2)
Albumin: 4.3 g/dL (ref 3.8–4.8)
Alkaline Phosphatase: 97 IU/L (ref 39–117)
BUN/Creatinine Ratio: 12 (ref 10–24)
BUN: 15 mg/dL (ref 8–27)
Bilirubin Total: 0.5 mg/dL (ref 0.0–1.2)
CO2: 25 mmol/L (ref 20–29)
Calcium: 9.1 mg/dL (ref 8.6–10.2)
Chloride: 101 mmol/L (ref 96–106)
Creatinine, Ser: 1.26 mg/dL (ref 0.76–1.27)
GFR calc Af Amer: 67 mL/min/{1.73_m2} (ref >59)
GFR calc non Af Amer: 58 mL/min/{1.73_m2} — ABNORMAL LOW (ref >59)
Globulin, Total: 2.5 g/dL (ref 1.5–4.5)
Glucose: 127 mg/dL — ABNORMAL HIGH (ref 65–99)
Potassium: 5.2 mmol/L (ref 3.5–5.2)
SODIUM: 142 mmol/L (ref 134–144)
Total Protein: 6.8 g/dL (ref 6.0–8.5)

## 2018-10-04 LAB — HEMOGLOBIN A1C
ESTIMATED AVERAGE GLUCOSE: 166 mg/dL
HEMOGLOBIN A1C: 7.4 % — AB (ref 4.8–5.6)

## 2018-10-04 LAB — VITAMIN D 25 HYDROXY (VIT D DEFICIENCY, FRACTURES): Vit D, 25-Hydroxy: 28.6 ng/mL — ABNORMAL LOW (ref 30.0–100.0)

## 2018-10-04 LAB — INSULIN, RANDOM: INSULIN: 14.4 u[IU]/mL (ref 2.6–24.9)

## 2018-10-11 DIAGNOSIS — M479 Spondylosis, unspecified: Secondary | ICD-10-CM | POA: Diagnosis not present

## 2018-10-13 DIAGNOSIS — M479 Spondylosis, unspecified: Secondary | ICD-10-CM | POA: Diagnosis not present

## 2018-10-17 ENCOUNTER — Ambulatory Visit (INDEPENDENT_AMBULATORY_CARE_PROVIDER_SITE_OTHER): Payer: PPO | Admitting: Bariatrics

## 2018-10-17 ENCOUNTER — Encounter (INDEPENDENT_AMBULATORY_CARE_PROVIDER_SITE_OTHER): Payer: Self-pay | Admitting: Bariatrics

## 2018-10-17 ENCOUNTER — Other Ambulatory Visit (INDEPENDENT_AMBULATORY_CARE_PROVIDER_SITE_OTHER): Payer: Self-pay | Admitting: Bariatrics

## 2018-10-17 VITALS — BP 122/74 | HR 66 | Temp 97.9°F | Ht 72.0 in | Wt 311.0 lb

## 2018-10-17 DIAGNOSIS — E119 Type 2 diabetes mellitus without complications: Secondary | ICD-10-CM

## 2018-10-17 DIAGNOSIS — E559 Vitamin D deficiency, unspecified: Secondary | ICD-10-CM

## 2018-10-17 DIAGNOSIS — I10 Essential (primary) hypertension: Secondary | ICD-10-CM | POA: Diagnosis not present

## 2018-10-17 DIAGNOSIS — R740 Nonspecific elevation of levels of transaminase and lactic acid dehydrogenase [LDH]: Secondary | ICD-10-CM | POA: Diagnosis not present

## 2018-10-17 DIAGNOSIS — R7401 Elevation of levels of liver transaminase levels: Secondary | ICD-10-CM

## 2018-10-17 DIAGNOSIS — Z6841 Body Mass Index (BMI) 40.0 and over, adult: Secondary | ICD-10-CM | POA: Diagnosis not present

## 2018-10-17 MED ORDER — VITAMIN D (ERGOCALCIFEROL) 1.25 MG (50000 UNIT) PO CAPS: 50000.0000 [IU] | ORAL_CAPSULE | ORAL | 0 refills | Status: DC

## 2018-10-17 MED ORDER — METFORMIN HCL 500 MG PO TABS: 500.0000 mg | ORAL_TABLET | Freq: Every day | ORAL | 0 refills | Status: DC
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
## 2018-10-18 DIAGNOSIS — E1142 Type 2 diabetes mellitus with diabetic polyneuropathy: Secondary | ICD-10-CM | POA: Insufficient documentation

## 2018-10-18 DIAGNOSIS — R7401 Elevation of levels of liver transaminase levels: Secondary | ICD-10-CM | POA: Insufficient documentation

## 2018-10-18 DIAGNOSIS — E119 Type 2 diabetes mellitus without complications: Secondary | ICD-10-CM | POA: Insufficient documentation

## 2018-10-18 DIAGNOSIS — R740 Nonspecific elevation of levels of transaminase and lactic acid dehydrogenase [LDH]: Secondary | ICD-10-CM

## 2018-10-18 DIAGNOSIS — E559 Vitamin D deficiency, unspecified: Secondary | ICD-10-CM | POA: Insufficient documentation

## 2018-10-18 DIAGNOSIS — I10 Essential (primary) hypertension: Secondary | ICD-10-CM | POA: Insufficient documentation

## 2018-11-02 ENCOUNTER — Ambulatory Visit (INDEPENDENT_AMBULATORY_CARE_PROVIDER_SITE_OTHER): Payer: PPO | Admitting: Bariatrics

## 2018-11-02 ENCOUNTER — Encounter (INDEPENDENT_AMBULATORY_CARE_PROVIDER_SITE_OTHER): Payer: Self-pay | Admitting: Bariatrics

## 2018-11-02 ENCOUNTER — Other Ambulatory Visit: Payer: Self-pay

## 2018-11-02 VITALS — BP 125/69 | HR 66 | Temp 97.7°F | Ht 72.0 in | Wt 307.0 lb

## 2018-11-02 DIAGNOSIS — E559 Vitamin D deficiency, unspecified: Secondary | ICD-10-CM

## 2018-11-02 DIAGNOSIS — Z6841 Body Mass Index (BMI) 40.0 and over, adult: Secondary | ICD-10-CM

## 2018-11-02 DIAGNOSIS — E119 Type 2 diabetes mellitus without complications: Secondary | ICD-10-CM

## 2018-11-02 DIAGNOSIS — I10 Essential (primary) hypertension: Secondary | ICD-10-CM

## 2018-11-02 MED ORDER — VITAMIN D (ERGOCALCIFEROL) 1.25 MG (50000 UNIT) PO CAPS: 50000.0000 [IU] | ORAL_CAPSULE | ORAL | 0 refills | Status: DC
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
## 2018-11-03 ENCOUNTER — Encounter (INDEPENDENT_AMBULATORY_CARE_PROVIDER_SITE_OTHER): Payer: Self-pay

## 2018-11-08 ENCOUNTER — Encounter (INDEPENDENT_AMBULATORY_CARE_PROVIDER_SITE_OTHER): Payer: Self-pay

## 2018-11-14 DIAGNOSIS — M79662 Pain in left lower leg: Secondary | ICD-10-CM | POA: Diagnosis not present

## 2018-11-14 DIAGNOSIS — J45909 Unspecified asthma, uncomplicated: Secondary | ICD-10-CM | POA: Diagnosis not present

## 2018-11-14 DIAGNOSIS — N401 Enlarged prostate with lower urinary tract symptoms: Secondary | ICD-10-CM | POA: Diagnosis not present

## 2018-11-14 DIAGNOSIS — I10 Essential (primary) hypertension: Secondary | ICD-10-CM | POA: Diagnosis not present

## 2018-11-14 DIAGNOSIS — E039 Hypothyroidism, unspecified: Secondary | ICD-10-CM | POA: Diagnosis not present

## 2018-11-16 ENCOUNTER — Other Ambulatory Visit: Payer: Self-pay

## 2018-11-16 ENCOUNTER — Ambulatory Visit (INDEPENDENT_AMBULATORY_CARE_PROVIDER_SITE_OTHER): Payer: PPO | Admitting: Bariatrics

## 2018-11-16 ENCOUNTER — Other Ambulatory Visit (INDEPENDENT_AMBULATORY_CARE_PROVIDER_SITE_OTHER): Payer: Self-pay | Admitting: Bariatrics

## 2018-11-16 ENCOUNTER — Encounter (INDEPENDENT_AMBULATORY_CARE_PROVIDER_SITE_OTHER): Payer: Self-pay | Admitting: Bariatrics

## 2018-11-16 DIAGNOSIS — E119 Type 2 diabetes mellitus without complications: Secondary | ICD-10-CM

## 2018-11-16 DIAGNOSIS — Z6841 Body Mass Index (BMI) 40.0 and over, adult: Secondary | ICD-10-CM

## 2018-11-16 DIAGNOSIS — I10 Essential (primary) hypertension: Secondary | ICD-10-CM

## 2018-11-16 DIAGNOSIS — E559 Vitamin D deficiency, unspecified: Secondary | ICD-10-CM | POA: Diagnosis not present

## 2018-11-16 MED ORDER — METFORMIN HCL 500 MG PO TABS: 500.0000 mg | ORAL_TABLET | Freq: Every day | ORAL | 0 refills | Status: DC
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
## 2018-11-30 ENCOUNTER — Other Ambulatory Visit: Payer: Self-pay

## 2018-11-30 ENCOUNTER — Encounter (INDEPENDENT_AMBULATORY_CARE_PROVIDER_SITE_OTHER): Payer: Self-pay | Admitting: Bariatrics

## 2018-11-30 ENCOUNTER — Ambulatory Visit (INDEPENDENT_AMBULATORY_CARE_PROVIDER_SITE_OTHER): Payer: PPO | Admitting: Bariatrics

## 2018-11-30 DIAGNOSIS — K5909 Other constipation: Secondary | ICD-10-CM

## 2018-11-30 DIAGNOSIS — E66813 Obesity, class 3: Secondary | ICD-10-CM

## 2018-11-30 DIAGNOSIS — E119 Type 2 diabetes mellitus without complications: Secondary | ICD-10-CM

## 2018-11-30 DIAGNOSIS — Z6841 Body Mass Index (BMI) 40.0 and over, adult: Secondary | ICD-10-CM | POA: Diagnosis not present

## 2018-11-30 DIAGNOSIS — E559 Vitamin D deficiency, unspecified: Secondary | ICD-10-CM

## 2018-11-30 DIAGNOSIS — I10 Essential (primary) hypertension: Secondary | ICD-10-CM

## 2018-11-30 MED ORDER — VITAMIN D (ERGOCALCIFEROL) 1.25 MG (50000 UNIT) PO CAPS: 50000.0000 [IU] | ORAL_CAPSULE | ORAL | 0 refills | Status: DC
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
## 2018-12-01 DIAGNOSIS — G4733 Obstructive sleep apnea (adult) (pediatric): Secondary | ICD-10-CM | POA: Diagnosis not present

## 2018-12-13 DIAGNOSIS — I10 Essential (primary) hypertension: Secondary | ICD-10-CM | POA: Diagnosis not present

## 2018-12-13 DIAGNOSIS — N401 Enlarged prostate with lower urinary tract symptoms: Secondary | ICD-10-CM | POA: Diagnosis not present

## 2018-12-13 DIAGNOSIS — J45909 Unspecified asthma, uncomplicated: Secondary | ICD-10-CM | POA: Diagnosis not present

## 2018-12-13 DIAGNOSIS — E039 Hypothyroidism, unspecified: Secondary | ICD-10-CM | POA: Diagnosis not present

## 2018-12-14 ENCOUNTER — Ambulatory Visit (INDEPENDENT_AMBULATORY_CARE_PROVIDER_SITE_OTHER): Payer: PPO | Admitting: Bariatrics

## 2018-12-14 ENCOUNTER — Other Ambulatory Visit: Payer: Self-pay

## 2018-12-14 ENCOUNTER — Encounter (INDEPENDENT_AMBULATORY_CARE_PROVIDER_SITE_OTHER): Payer: Self-pay | Admitting: Bariatrics

## 2018-12-14 DIAGNOSIS — I10 Essential (primary) hypertension: Secondary | ICD-10-CM

## 2018-12-14 DIAGNOSIS — Z6841 Body Mass Index (BMI) 40.0 and over, adult: Secondary | ICD-10-CM

## 2018-12-14 DIAGNOSIS — E119 Type 2 diabetes mellitus without complications: Secondary | ICD-10-CM | POA: Diagnosis not present

## 2018-12-14 DIAGNOSIS — E559 Vitamin D deficiency, unspecified: Secondary | ICD-10-CM | POA: Diagnosis not present

## 2018-12-14 DIAGNOSIS — E66813 Obesity, class 3: Secondary | ICD-10-CM
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
## 2018-12-28 ENCOUNTER — Ambulatory Visit (INDEPENDENT_AMBULATORY_CARE_PROVIDER_SITE_OTHER): Payer: PPO | Admitting: Bariatrics

## 2018-12-28 ENCOUNTER — Other Ambulatory Visit: Payer: Self-pay

## 2018-12-28 DIAGNOSIS — E8881 Metabolic syndrome: Secondary | ICD-10-CM

## 2018-12-28 DIAGNOSIS — Z6841 Body Mass Index (BMI) 40.0 and over, adult: Secondary | ICD-10-CM

## 2018-12-28 DIAGNOSIS — E559 Vitamin D deficiency, unspecified: Secondary | ICD-10-CM
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
## 2019-01-02 ENCOUNTER — Encounter (INDEPENDENT_AMBULATORY_CARE_PROVIDER_SITE_OTHER): Payer: Self-pay | Admitting: Bariatrics

## 2019-01-02 DIAGNOSIS — E8881 Metabolic syndrome: Secondary | ICD-10-CM | POA: Insufficient documentation

## 2019-01-12 ENCOUNTER — Other Ambulatory Visit: Payer: Self-pay

## 2019-01-12 ENCOUNTER — Ambulatory Visit (INDEPENDENT_AMBULATORY_CARE_PROVIDER_SITE_OTHER): Payer: PPO | Admitting: Bariatrics

## 2019-01-12 ENCOUNTER — Encounter (INDEPENDENT_AMBULATORY_CARE_PROVIDER_SITE_OTHER): Payer: Self-pay | Admitting: Bariatrics

## 2019-01-12 DIAGNOSIS — I10 Essential (primary) hypertension: Secondary | ICD-10-CM

## 2019-01-12 DIAGNOSIS — J45909 Unspecified asthma, uncomplicated: Secondary | ICD-10-CM | POA: Diagnosis not present

## 2019-01-12 DIAGNOSIS — E559 Vitamin D deficiency, unspecified: Secondary | ICD-10-CM | POA: Diagnosis not present

## 2019-01-12 DIAGNOSIS — E119 Type 2 diabetes mellitus without complications: Secondary | ICD-10-CM

## 2019-01-12 DIAGNOSIS — H9201 Otalgia, right ear: Secondary | ICD-10-CM | POA: Diagnosis not present

## 2019-01-12 DIAGNOSIS — Z6841 Body Mass Index (BMI) 40.0 and over, adult: Secondary | ICD-10-CM | POA: Diagnosis not present

## 2019-01-12 DIAGNOSIS — E039 Hypothyroidism, unspecified: Secondary | ICD-10-CM | POA: Diagnosis not present

## 2019-01-12 DIAGNOSIS — N401 Enlarged prostate with lower urinary tract symptoms: Secondary | ICD-10-CM | POA: Diagnosis not present

## 2019-01-12 MED ORDER — METFORMIN HCL 500 MG PO TABS: 500.0000 mg | ORAL_TABLET | Freq: Every day | ORAL | 0 refills | Status: DC

## 2019-01-13 DIAGNOSIS — H6122 Impacted cerumen, left ear: Secondary | ICD-10-CM | POA: Diagnosis not present

## 2019-01-13 DIAGNOSIS — H9201 Otalgia, right ear: Secondary | ICD-10-CM | POA: Diagnosis not present

## 2019-01-13 DIAGNOSIS — H938X2 Other specified disorders of left ear: Secondary | ICD-10-CM | POA: Diagnosis not present

## 2019-01-13 DIAGNOSIS — H60331 Swimmer's ear, right ear: Secondary | ICD-10-CM | POA: Diagnosis not present
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
## 2019-01-26 ENCOUNTER — Ambulatory Visit (INDEPENDENT_AMBULATORY_CARE_PROVIDER_SITE_OTHER): Payer: PPO | Admitting: Bariatrics

## 2019-01-26 ENCOUNTER — Encounter (INDEPENDENT_AMBULATORY_CARE_PROVIDER_SITE_OTHER): Payer: Self-pay | Admitting: Bariatrics

## 2019-01-26 ENCOUNTER — Other Ambulatory Visit: Payer: Self-pay

## 2019-01-26 DIAGNOSIS — Z6841 Body Mass Index (BMI) 40.0 and over, adult: Secondary | ICD-10-CM | POA: Diagnosis not present

## 2019-01-26 DIAGNOSIS — E119 Type 2 diabetes mellitus without complications: Secondary | ICD-10-CM | POA: Diagnosis not present

## 2019-01-26 DIAGNOSIS — E559 Vitamin D deficiency, unspecified: Secondary | ICD-10-CM

## 2019-01-30 DIAGNOSIS — H6240 Otitis externa in other diseases classified elsewhere, unspecified ear: Secondary | ICD-10-CM | POA: Diagnosis not present

## 2019-01-30 DIAGNOSIS — H6981 Other specified disorders of Eustachian tube, right ear: Secondary | ICD-10-CM | POA: Diagnosis not present

## 2019-01-30 DIAGNOSIS — B369 Superficial mycosis, unspecified: Secondary | ICD-10-CM | POA: Diagnosis not present
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
## 2019-02-09 ENCOUNTER — Other Ambulatory Visit (INDEPENDENT_AMBULATORY_CARE_PROVIDER_SITE_OTHER): Payer: Self-pay

## 2019-02-09 DIAGNOSIS — J45909 Unspecified asthma, uncomplicated: Secondary | ICD-10-CM | POA: Diagnosis not present

## 2019-02-09 DIAGNOSIS — E559 Vitamin D deficiency, unspecified: Secondary | ICD-10-CM

## 2019-02-09 DIAGNOSIS — E039 Hypothyroidism, unspecified: Secondary | ICD-10-CM | POA: Diagnosis not present

## 2019-02-09 DIAGNOSIS — I10 Essential (primary) hypertension: Secondary | ICD-10-CM | POA: Diagnosis not present

## 2019-02-09 DIAGNOSIS — N401 Enlarged prostate with lower urinary tract symptoms: Secondary | ICD-10-CM | POA: Diagnosis not present

## 2019-02-09 MED ORDER — VITAMIN D (ERGOCALCIFEROL) 1.25 MG (50000 UNIT) PO CAPS: 50000.0000 [IU] | ORAL_CAPSULE | ORAL | 0 refills | Status: DC

## 2019-02-12 ENCOUNTER — Other Ambulatory Visit (INDEPENDENT_AMBULATORY_CARE_PROVIDER_SITE_OTHER): Payer: Self-pay | Admitting: Bariatrics

## 2019-02-12 DIAGNOSIS — E119 Type 2 diabetes mellitus without complications: Secondary | ICD-10-CM

## 2019-02-13 ENCOUNTER — Other Ambulatory Visit (INDEPENDENT_AMBULATORY_CARE_PROVIDER_SITE_OTHER): Payer: Self-pay | Admitting: Bariatrics

## 2019-02-13 DIAGNOSIS — E119 Type 2 diabetes mellitus without complications: Secondary | ICD-10-CM

## 2019-02-14 ENCOUNTER — Telehealth (INDEPENDENT_AMBULATORY_CARE_PROVIDER_SITE_OTHER): Payer: Self-pay | Admitting: Bariatrics

## 2019-02-14 NOTE — Telephone Encounter (Signed)
Patient states [facility] has not received his diabetes prescription request from our office.  Please advise.

## 2019-02-14 NOTE — Telephone Encounter (Signed)
Called pharmacy and Metformin is there, they are calling pt - LM - CMA

## 2019-02-14 NOTE — Telephone Encounter (Signed)
Patient called and said he went to pick up his medicine from the pharmacy and it wasn't there after he was told it was approved.  He would like a phone call to explain why he can't get his medicine.

## 2019-02-15 ENCOUNTER — Other Ambulatory Visit: Payer: Self-pay

## 2019-02-15 ENCOUNTER — Encounter (INDEPENDENT_AMBULATORY_CARE_PROVIDER_SITE_OTHER): Payer: Self-pay | Admitting: Bariatrics

## 2019-02-15 ENCOUNTER — Ambulatory Visit (INDEPENDENT_AMBULATORY_CARE_PROVIDER_SITE_OTHER): Payer: PPO | Admitting: Bariatrics

## 2019-02-15 VITALS — BP 117/63 | HR 57 | Temp 98.9°F | Ht 72.0 in | Wt 286.0 lb

## 2019-02-15 DIAGNOSIS — E559 Vitamin D deficiency, unspecified: Secondary | ICD-10-CM | POA: Diagnosis not present

## 2019-02-15 DIAGNOSIS — R7401 Elevation of levels of liver transaminase levels: Secondary | ICD-10-CM

## 2019-02-15 DIAGNOSIS — R740 Nonspecific elevation of levels of transaminase and lactic acid dehydrogenase [LDH]: Secondary | ICD-10-CM | POA: Diagnosis not present

## 2019-02-15 DIAGNOSIS — E119 Type 2 diabetes mellitus without complications: Secondary | ICD-10-CM

## 2019-02-15 DIAGNOSIS — Z6838 Body mass index (BMI) 38.0-38.9, adult: Secondary | ICD-10-CM | POA: Diagnosis not present

## 2019-02-15 DIAGNOSIS — I10 Essential (primary) hypertension: Secondary | ICD-10-CM
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
## 2019-03-07 DIAGNOSIS — G4733 Obstructive sleep apnea (adult) (pediatric): Secondary | ICD-10-CM | POA: Diagnosis not present

## 2019-03-13 ENCOUNTER — Ambulatory Visit (INDEPENDENT_AMBULATORY_CARE_PROVIDER_SITE_OTHER): Payer: PPO | Admitting: Bariatrics

## 2019-03-13 ENCOUNTER — Encounter (INDEPENDENT_AMBULATORY_CARE_PROVIDER_SITE_OTHER): Payer: Self-pay | Admitting: Bariatrics

## 2019-03-13 ENCOUNTER — Other Ambulatory Visit: Payer: Self-pay

## 2019-03-13 VITALS — BP 99/61 | HR 67 | Temp 98.3°F | Ht 72.0 in | Wt 284.0 lb

## 2019-03-13 DIAGNOSIS — E559 Vitamin D deficiency, unspecified: Secondary | ICD-10-CM | POA: Diagnosis not present

## 2019-03-13 DIAGNOSIS — Z9189 Other specified personal risk factors, not elsewhere classified: Secondary | ICD-10-CM | POA: Diagnosis not present

## 2019-03-13 DIAGNOSIS — Z6838 Body mass index (BMI) 38.0-38.9, adult: Secondary | ICD-10-CM

## 2019-03-13 DIAGNOSIS — E119 Type 2 diabetes mellitus without complications: Secondary | ICD-10-CM

## 2019-03-13 MED ORDER — VITAMIN D (ERGOCALCIFEROL) 1.25 MG (50000 UNIT) PO CAPS: 50000.0000 [IU] | ORAL_CAPSULE | ORAL | 0 refills | Status: DC
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
## 2019-03-14 DIAGNOSIS — I10 Essential (primary) hypertension: Secondary | ICD-10-CM | POA: Diagnosis not present

## 2019-03-14 DIAGNOSIS — E039 Hypothyroidism, unspecified: Secondary | ICD-10-CM | POA: Diagnosis not present

## 2019-03-14 DIAGNOSIS — N401 Enlarged prostate with lower urinary tract symptoms: Secondary | ICD-10-CM | POA: Diagnosis not present

## 2019-03-14 DIAGNOSIS — J45909 Unspecified asthma, uncomplicated: Secondary | ICD-10-CM | POA: Diagnosis not present

## 2019-03-14 DIAGNOSIS — E1169 Type 2 diabetes mellitus with other specified complication: Secondary | ICD-10-CM | POA: Diagnosis not present

## 2019-03-14 LAB — LIPID PANEL WITH LDL/HDL RATIO
Cholesterol, Total: 118 mg/dL (ref 100–199)
HDL: 35 mg/dL — ABNORMAL LOW (ref >39)
LDL Calculated: 59 mg/dL (ref 0–99)
LDl/HDL Ratio: 1.7 ratio (ref 0.0–3.6)
Triglycerides: 120 mg/dL (ref 0–149)
VLDL Cholesterol Cal: 24 mg/dL (ref 5–40)

## 2019-03-14 LAB — COMPREHENSIVE METABOLIC PANEL
ALT: 13 IU/L (ref 0–44)
AST: 18 IU/L (ref 0–40)
Albumin/Globulin Ratio: 1.7 (ref 1.2–2.2)
Albumin: 4.4 g/dL (ref 3.8–4.8)
Alkaline Phosphatase: 94 IU/L (ref 39–117)
BUN/Creatinine Ratio: 20 (ref 10–24)
BUN: 23 mg/dL (ref 8–27)
Bilirubin Total: 0.5 mg/dL (ref 0.0–1.2)
CO2: 25 mmol/L (ref 20–29)
Calcium: 9.4 mg/dL (ref 8.6–10.2)
Chloride: 101 mmol/L (ref 96–106)
Creatinine, Ser: 1.17 mg/dL (ref 0.76–1.27)
GFR calc Af Amer: 73 mL/min/{1.73_m2} (ref >59)
GFR calc non Af Amer: 63 mL/min/{1.73_m2} (ref >59)
Globulin, Total: 2.6 g/dL (ref 1.5–4.5)
Glucose: 108 mg/dL — ABNORMAL HIGH (ref 65–99)
Potassium: 4.6 mmol/L (ref 3.5–5.2)
Sodium: 139 mmol/L (ref 134–144)
Total Protein: 7 g/dL (ref 6.0–8.5)

## 2019-03-14 LAB — VITAMIN D 25 HYDROXY (VIT D DEFICIENCY, FRACTURES): Vit D, 25-Hydroxy: 53.9 ng/mL (ref 30.0–100.0)

## 2019-03-14 LAB — HEMOGLOBIN A1C
Est. average glucose Bld gHb Est-mCnc: 123 mg/dL
Hgb A1c MFr Bld: 5.9 % — ABNORMAL HIGH (ref 4.8–5.6)

## 2019-03-14 LAB — INSULIN, RANDOM: INSULIN: 10.8 u[IU]/mL (ref 2.6–24.9)

## 2019-04-13 DIAGNOSIS — E039 Hypothyroidism, unspecified: Secondary | ICD-10-CM | POA: Diagnosis not present

## 2019-04-13 DIAGNOSIS — E1169 Type 2 diabetes mellitus with other specified complication: Secondary | ICD-10-CM | POA: Diagnosis not present

## 2019-04-13 DIAGNOSIS — I10 Essential (primary) hypertension: Secondary | ICD-10-CM | POA: Diagnosis not present

## 2019-04-13 DIAGNOSIS — N401 Enlarged prostate with lower urinary tract symptoms: Secondary | ICD-10-CM | POA: Diagnosis not present

## 2019-04-13 DIAGNOSIS — J45909 Unspecified asthma, uncomplicated: Secondary | ICD-10-CM | POA: Diagnosis not present

## 2019-05-09 DIAGNOSIS — E1169 Type 2 diabetes mellitus with other specified complication: Secondary | ICD-10-CM | POA: Diagnosis not present

## 2019-05-09 DIAGNOSIS — N401 Enlarged prostate with lower urinary tract symptoms: Secondary | ICD-10-CM | POA: Diagnosis not present

## 2019-05-09 DIAGNOSIS — J45909 Unspecified asthma, uncomplicated: Secondary | ICD-10-CM | POA: Diagnosis not present

## 2019-05-09 DIAGNOSIS — I10 Essential (primary) hypertension: Secondary | ICD-10-CM | POA: Diagnosis not present

## 2019-05-09 DIAGNOSIS — E039 Hypothyroidism, unspecified: Secondary | ICD-10-CM | POA: Diagnosis not present

## 2019-05-31 ENCOUNTER — Other Ambulatory Visit: Payer: Self-pay

## 2019-05-31 ENCOUNTER — Ambulatory Visit (INDEPENDENT_AMBULATORY_CARE_PROVIDER_SITE_OTHER): Payer: PPO | Admitting: Bariatrics

## 2019-05-31 ENCOUNTER — Encounter (INDEPENDENT_AMBULATORY_CARE_PROVIDER_SITE_OTHER): Payer: Self-pay | Admitting: Bariatrics

## 2019-05-31 ENCOUNTER — Other Ambulatory Visit (INDEPENDENT_AMBULATORY_CARE_PROVIDER_SITE_OTHER): Payer: Self-pay | Admitting: Bariatrics

## 2019-05-31 VITALS — BP 119/73 | HR 71 | Temp 98.3°F | Ht 72.0 in | Wt 288.0 lb

## 2019-05-31 DIAGNOSIS — I10 Essential (primary) hypertension: Secondary | ICD-10-CM | POA: Diagnosis not present

## 2019-05-31 DIAGNOSIS — Z6839 Body mass index (BMI) 39.0-39.9, adult: Secondary | ICD-10-CM

## 2019-05-31 DIAGNOSIS — E119 Type 2 diabetes mellitus without complications: Secondary | ICD-10-CM | POA: Diagnosis not present

## 2019-05-31 DIAGNOSIS — E559 Vitamin D deficiency, unspecified: Secondary | ICD-10-CM | POA: Diagnosis not present

## 2019-05-31 MED ORDER — METFORMIN HCL 500 MG PO TABS: ORAL_TABLET | ORAL | 0 refills | Status: DC

## 2019-06-05 ENCOUNTER — Encounter (INDEPENDENT_AMBULATORY_CARE_PROVIDER_SITE_OTHER): Payer: Self-pay | Admitting: Bariatrics

## 2019-06-05 DIAGNOSIS — G4733 Obstructive sleep apnea (adult) (pediatric): Secondary | ICD-10-CM | POA: Diagnosis not present
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
## 2019-06-15 DIAGNOSIS — E039 Hypothyroidism, unspecified: Secondary | ICD-10-CM | POA: Diagnosis not present

## 2019-06-15 DIAGNOSIS — E1169 Type 2 diabetes mellitus with other specified complication: Secondary | ICD-10-CM | POA: Diagnosis not present

## 2019-06-15 DIAGNOSIS — N401 Enlarged prostate with lower urinary tract symptoms: Secondary | ICD-10-CM | POA: Diagnosis not present

## 2019-06-15 DIAGNOSIS — I10 Essential (primary) hypertension: Secondary | ICD-10-CM | POA: Diagnosis not present

## 2019-06-15 DIAGNOSIS — E7800 Pure hypercholesterolemia, unspecified: Secondary | ICD-10-CM | POA: Diagnosis not present

## 2019-06-15 DIAGNOSIS — J45909 Unspecified asthma, uncomplicated: Secondary | ICD-10-CM | POA: Diagnosis not present

## 2019-06-21 ENCOUNTER — Other Ambulatory Visit (INDEPENDENT_AMBULATORY_CARE_PROVIDER_SITE_OTHER): Payer: Self-pay | Admitting: Bariatrics

## 2019-06-21 DIAGNOSIS — E119 Type 2 diabetes mellitus without complications: Secondary | ICD-10-CM

## 2019-06-22 ENCOUNTER — Ambulatory Visit (INDEPENDENT_AMBULATORY_CARE_PROVIDER_SITE_OTHER): Payer: PPO | Admitting: Bariatrics

## 2019-06-23 ENCOUNTER — Other Ambulatory Visit (INDEPENDENT_AMBULATORY_CARE_PROVIDER_SITE_OTHER): Payer: Self-pay | Admitting: Bariatrics

## 2019-06-23 DIAGNOSIS — E119 Type 2 diabetes mellitus without complications: Secondary | ICD-10-CM

## 2019-07-17 DIAGNOSIS — N401 Enlarged prostate with lower urinary tract symptoms: Secondary | ICD-10-CM | POA: Diagnosis not present

## 2019-07-17 DIAGNOSIS — J45909 Unspecified asthma, uncomplicated: Secondary | ICD-10-CM | POA: Diagnosis not present

## 2019-07-17 DIAGNOSIS — E1169 Type 2 diabetes mellitus with other specified complication: Secondary | ICD-10-CM | POA: Diagnosis not present

## 2019-07-17 DIAGNOSIS — E7800 Pure hypercholesterolemia, unspecified: Secondary | ICD-10-CM | POA: Diagnosis not present

## 2019-07-17 DIAGNOSIS — I10 Essential (primary) hypertension: Secondary | ICD-10-CM | POA: Diagnosis not present

## 2019-07-17 DIAGNOSIS — Z7984 Long term (current) use of oral hypoglycemic drugs: Secondary | ICD-10-CM | POA: Diagnosis not present

## 2019-07-17 DIAGNOSIS — E039 Hypothyroidism, unspecified: Secondary | ICD-10-CM | POA: Diagnosis not present

## 2019-07-18 ENCOUNTER — Ambulatory Visit (INDEPENDENT_AMBULATORY_CARE_PROVIDER_SITE_OTHER): Payer: PPO | Admitting: Bariatrics

## 2019-07-31 DIAGNOSIS — M25551 Pain in right hip: Secondary | ICD-10-CM | POA: Diagnosis not present

## 2019-07-31 DIAGNOSIS — Z6835 Body mass index (BMI) 35.0-35.9, adult: Secondary | ICD-10-CM | POA: Diagnosis not present

## 2019-08-01 DIAGNOSIS — Z0000 Encounter for general adult medical examination without abnormal findings: Secondary | ICD-10-CM | POA: Diagnosis not present

## 2019-08-01 DIAGNOSIS — E119 Type 2 diabetes mellitus without complications: Secondary | ICD-10-CM | POA: Diagnosis not present

## 2019-08-01 DIAGNOSIS — I10 Essential (primary) hypertension: Secondary | ICD-10-CM | POA: Diagnosis not present

## 2019-08-01 DIAGNOSIS — Z7984 Long term (current) use of oral hypoglycemic drugs: Secondary | ICD-10-CM | POA: Diagnosis not present

## 2019-08-01 DIAGNOSIS — Z125 Encounter for screening for malignant neoplasm of prostate: Secondary | ICD-10-CM | POA: Diagnosis not present

## 2019-08-01 DIAGNOSIS — E039 Hypothyroidism, unspecified: Secondary | ICD-10-CM | POA: Diagnosis not present

## 2019-08-01 DIAGNOSIS — Z1389 Encounter for screening for other disorder: Secondary | ICD-10-CM | POA: Diagnosis not present

## 2019-08-01 DIAGNOSIS — E7800 Pure hypercholesterolemia, unspecified: Secondary | ICD-10-CM | POA: Diagnosis not present

## 2019-08-01 DIAGNOSIS — Z23 Encounter for immunization: Secondary | ICD-10-CM | POA: Diagnosis not present

## 2019-08-01 DIAGNOSIS — K219 Gastro-esophageal reflux disease without esophagitis: Secondary | ICD-10-CM | POA: Diagnosis not present

## 2019-08-15 DIAGNOSIS — N401 Enlarged prostate with lower urinary tract symptoms: Secondary | ICD-10-CM | POA: Diagnosis not present

## 2019-08-15 DIAGNOSIS — E1169 Type 2 diabetes mellitus with other specified complication: Secondary | ICD-10-CM | POA: Diagnosis not present

## 2019-08-15 DIAGNOSIS — I10 Essential (primary) hypertension: Secondary | ICD-10-CM | POA: Diagnosis not present

## 2019-08-15 DIAGNOSIS — E7800 Pure hypercholesterolemia, unspecified: Secondary | ICD-10-CM | POA: Diagnosis not present

## 2019-08-15 DIAGNOSIS — E039 Hypothyroidism, unspecified: Secondary | ICD-10-CM | POA: Diagnosis not present

## 2019-08-15 DIAGNOSIS — J45909 Unspecified asthma, uncomplicated: Secondary | ICD-10-CM | POA: Diagnosis not present

## 2019-08-15 DIAGNOSIS — Z7984 Long term (current) use of oral hypoglycemic drugs: Secondary | ICD-10-CM | POA: Diagnosis not present

## 2019-09-07 DIAGNOSIS — Z7984 Long term (current) use of oral hypoglycemic drugs: Secondary | ICD-10-CM | POA: Diagnosis not present

## 2019-09-07 DIAGNOSIS — N401 Enlarged prostate with lower urinary tract symptoms: Secondary | ICD-10-CM | POA: Diagnosis not present

## 2019-09-07 DIAGNOSIS — J45909 Unspecified asthma, uncomplicated: Secondary | ICD-10-CM | POA: Diagnosis not present

## 2019-09-07 DIAGNOSIS — E039 Hypothyroidism, unspecified: Secondary | ICD-10-CM | POA: Diagnosis not present

## 2019-09-07 DIAGNOSIS — E1169 Type 2 diabetes mellitus with other specified complication: Secondary | ICD-10-CM | POA: Diagnosis not present

## 2019-09-07 DIAGNOSIS — E7800 Pure hypercholesterolemia, unspecified: Secondary | ICD-10-CM | POA: Diagnosis not present

## 2019-09-07 DIAGNOSIS — I10 Essential (primary) hypertension: Secondary | ICD-10-CM | POA: Diagnosis not present

## 2019-09-18 ENCOUNTER — Other Ambulatory Visit: Payer: Self-pay

## 2019-09-18 ENCOUNTER — Ambulatory Visit (INDEPENDENT_AMBULATORY_CARE_PROVIDER_SITE_OTHER): Payer: PPO

## 2019-09-18 ENCOUNTER — Telehealth: Payer: Self-pay | Admitting: Physician Assistant

## 2019-09-18 ENCOUNTER — Ambulatory Visit (INDEPENDENT_AMBULATORY_CARE_PROVIDER_SITE_OTHER): Payer: PPO | Admitting: Physician Assistant

## 2019-09-18 ENCOUNTER — Encounter: Payer: Self-pay | Admitting: Physician Assistant

## 2019-09-18 VITALS — Ht 72.0 in | Wt 302.0 lb

## 2019-09-18 DIAGNOSIS — M25551 Pain in right hip: Secondary | ICD-10-CM

## 2019-09-18 DIAGNOSIS — M5416 Radiculopathy, lumbar region: Secondary | ICD-10-CM

## 2019-09-18 MED ORDER — CYCLOBENZAPRINE HCL 10 MG PO TABS: 10.0000 mg | ORAL_TABLET | Freq: Three times a day (TID) | ORAL | 1 refills | Status: DC | PRN

## 2019-09-18 MED ORDER — TRAMADOL HCL 50 MG PO TABS: 50.0000 mg | ORAL_TABLET | Freq: Four times a day (QID) | ORAL | 0 refills | Status: DC | PRN

## 2019-09-18 NOTE — Telephone Encounter (Signed)
Patient called requesting referral be resent to [place] for PT. Patient states [facility] did not receive fax. Patient phone number is [phone].

## 2019-09-18 NOTE — Addendum Note (Signed)
Addended by: Michae Kava B on: 09/18/2019 03:37 PM   Modules accepted: Orders
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
## 2019-09-19 ENCOUNTER — Other Ambulatory Visit: Payer: Self-pay

## 2019-09-19 DIAGNOSIS — M5416 Radiculopathy, lumbar region: Secondary | ICD-10-CM

## 2019-09-19 DIAGNOSIS — M25551 Pain in right hip: Secondary | ICD-10-CM

## 2019-09-19 NOTE — Telephone Encounter (Signed)
This was originally a Rx to [facility] that we had given him per his request But I have sent a new order upstairs to PT

## 2019-09-20 ENCOUNTER — Telehealth: Payer: Self-pay

## 2019-09-20 ENCOUNTER — Other Ambulatory Visit: Payer: Self-pay

## 2019-09-20 DIAGNOSIS — M5416 Radiculopathy, lumbar region: Secondary | ICD-10-CM | POA: Diagnosis not present

## 2019-09-20 DIAGNOSIS — M7061 Trochanteric bursitis, right hip: Secondary | ICD-10-CM | POA: Diagnosis not present

## 2019-09-20 MED ORDER — TIZANIDINE HCL 4 MG PO TABS: 4.0000 mg | ORAL_TABLET | Freq: Three times a day (TID) | ORAL | 0 refills | Status: DC | PRN

## 2019-09-20 NOTE — Telephone Encounter (Signed)
Faxed PT order to [facility] per PT   Fax # [phone]

## 2019-09-26 DIAGNOSIS — M5416 Radiculopathy, lumbar region: Secondary | ICD-10-CM | POA: Diagnosis not present

## 2019-09-26 DIAGNOSIS — M7061 Trochanteric bursitis, right hip: Secondary | ICD-10-CM | POA: Diagnosis not present

## 2019-09-28 DIAGNOSIS — M7061 Trochanteric bursitis, right hip: Secondary | ICD-10-CM | POA: Diagnosis not present

## 2019-09-28 DIAGNOSIS — M5416 Radiculopathy, lumbar region: Secondary | ICD-10-CM | POA: Diagnosis not present

## 2019-10-02 ENCOUNTER — Ambulatory Visit: Payer: PPO | Admitting: Physician Assistant

## 2019-10-03 DIAGNOSIS — M5416 Radiculopathy, lumbar region: Secondary | ICD-10-CM | POA: Diagnosis not present

## 2019-10-03 DIAGNOSIS — M7061 Trochanteric bursitis, right hip: Secondary | ICD-10-CM | POA: Diagnosis not present

## 2019-10-05 DIAGNOSIS — E1169 Type 2 diabetes mellitus with other specified complication: Secondary | ICD-10-CM | POA: Diagnosis not present

## 2019-10-05 DIAGNOSIS — N401 Enlarged prostate with lower urinary tract symptoms: Secondary | ICD-10-CM | POA: Diagnosis not present

## 2019-10-05 DIAGNOSIS — I10 Essential (primary) hypertension: Secondary | ICD-10-CM | POA: Diagnosis not present

## 2019-10-05 DIAGNOSIS — J45909 Unspecified asthma, uncomplicated: Secondary | ICD-10-CM | POA: Diagnosis not present

## 2019-10-05 DIAGNOSIS — E7800 Pure hypercholesterolemia, unspecified: Secondary | ICD-10-CM | POA: Diagnosis not present

## 2019-10-05 DIAGNOSIS — E039 Hypothyroidism, unspecified: Secondary | ICD-10-CM | POA: Diagnosis not present

## 2019-10-10 DIAGNOSIS — M5416 Radiculopathy, lumbar region: Secondary | ICD-10-CM | POA: Diagnosis not present

## 2019-10-10 DIAGNOSIS — M7061 Trochanteric bursitis, right hip: Secondary | ICD-10-CM | POA: Diagnosis not present

## 2019-10-12 DIAGNOSIS — M5416 Radiculopathy, lumbar region: Secondary | ICD-10-CM | POA: Diagnosis not present

## 2019-10-12 DIAGNOSIS — M7061 Trochanteric bursitis, right hip: Secondary | ICD-10-CM | POA: Diagnosis not present

## 2019-10-17 DIAGNOSIS — M5416 Radiculopathy, lumbar region: Secondary | ICD-10-CM | POA: Diagnosis not present

## 2019-10-17 DIAGNOSIS — M7061 Trochanteric bursitis, right hip: Secondary | ICD-10-CM | POA: Diagnosis not present

## 2019-10-19 DIAGNOSIS — M7061 Trochanteric bursitis, right hip: Secondary | ICD-10-CM | POA: Diagnosis not present

## 2019-10-19 DIAGNOSIS — M5416 Radiculopathy, lumbar region: Secondary | ICD-10-CM | POA: Diagnosis not present

## 2019-10-23 DIAGNOSIS — M5416 Radiculopathy, lumbar region: Secondary | ICD-10-CM | POA: Diagnosis not present

## 2019-10-23 DIAGNOSIS — M7061 Trochanteric bursitis, right hip: Secondary | ICD-10-CM | POA: Diagnosis not present

## 2019-10-26 DIAGNOSIS — M5416 Radiculopathy, lumbar region: Secondary | ICD-10-CM | POA: Diagnosis not present

## 2019-10-26 DIAGNOSIS — M7061 Trochanteric bursitis, right hip: Secondary | ICD-10-CM | POA: Diagnosis not present

## 2019-10-31 DIAGNOSIS — M7061 Trochanteric bursitis, right hip: Secondary | ICD-10-CM | POA: Diagnosis not present

## 2019-10-31 DIAGNOSIS — M5416 Radiculopathy, lumbar region: Secondary | ICD-10-CM | POA: Diagnosis not present

## 2019-11-07 DIAGNOSIS — M7061 Trochanteric bursitis, right hip: Secondary | ICD-10-CM | POA: Diagnosis not present

## 2019-11-07 DIAGNOSIS — M5416 Radiculopathy, lumbar region: Secondary | ICD-10-CM | POA: Diagnosis not present

## 2019-11-08 DIAGNOSIS — E039 Hypothyroidism, unspecified: Secondary | ICD-10-CM | POA: Diagnosis not present

## 2019-11-08 DIAGNOSIS — E1169 Type 2 diabetes mellitus with other specified complication: Secondary | ICD-10-CM | POA: Diagnosis not present

## 2019-11-08 DIAGNOSIS — E7800 Pure hypercholesterolemia, unspecified: Secondary | ICD-10-CM | POA: Diagnosis not present

## 2019-11-08 DIAGNOSIS — J45909 Unspecified asthma, uncomplicated: Secondary | ICD-10-CM | POA: Diagnosis not present

## 2019-11-08 DIAGNOSIS — N401 Enlarged prostate with lower urinary tract symptoms: Secondary | ICD-10-CM | POA: Diagnosis not present

## 2019-11-08 DIAGNOSIS — I10 Essential (primary) hypertension: Secondary | ICD-10-CM | POA: Diagnosis not present

## 2019-11-09 DIAGNOSIS — M5416 Radiculopathy, lumbar region: Secondary | ICD-10-CM | POA: Diagnosis not present

## 2019-11-09 DIAGNOSIS — M7061 Trochanteric bursitis, right hip: Secondary | ICD-10-CM | POA: Diagnosis not present

## 2019-11-29 DIAGNOSIS — J45909 Unspecified asthma, uncomplicated: Secondary | ICD-10-CM | POA: Diagnosis not present

## 2019-11-29 DIAGNOSIS — E7800 Pure hypercholesterolemia, unspecified: Secondary | ICD-10-CM | POA: Diagnosis not present

## 2019-11-29 DIAGNOSIS — I10 Essential (primary) hypertension: Secondary | ICD-10-CM | POA: Diagnosis not present

## 2019-11-29 DIAGNOSIS — N401 Enlarged prostate with lower urinary tract symptoms: Secondary | ICD-10-CM | POA: Diagnosis not present

## 2019-11-29 DIAGNOSIS — E039 Hypothyroidism, unspecified: Secondary | ICD-10-CM | POA: Diagnosis not present

## 2019-11-29 DIAGNOSIS — E1169 Type 2 diabetes mellitus with other specified complication: Secondary | ICD-10-CM | POA: Diagnosis not present

## 2019-12-04 DIAGNOSIS — G4733 Obstructive sleep apnea (adult) (pediatric): Secondary | ICD-10-CM | POA: Diagnosis not present

## 2020-01-02 DIAGNOSIS — I10 Essential (primary) hypertension: Secondary | ICD-10-CM | POA: Diagnosis not present

## 2020-01-02 DIAGNOSIS — J45909 Unspecified asthma, uncomplicated: Secondary | ICD-10-CM | POA: Diagnosis not present

## 2020-01-02 DIAGNOSIS — E7800 Pure hypercholesterolemia, unspecified: Secondary | ICD-10-CM | POA: Diagnosis not present

## 2020-01-02 DIAGNOSIS — N401 Enlarged prostate with lower urinary tract symptoms: Secondary | ICD-10-CM | POA: Diagnosis not present

## 2020-01-02 DIAGNOSIS — E039 Hypothyroidism, unspecified: Secondary | ICD-10-CM | POA: Diagnosis not present

## 2020-01-02 DIAGNOSIS — E1169 Type 2 diabetes mellitus with other specified complication: Secondary | ICD-10-CM | POA: Diagnosis not present

## 2020-02-13 DIAGNOSIS — N401 Enlarged prostate with lower urinary tract symptoms: Secondary | ICD-10-CM | POA: Diagnosis not present

## 2020-02-13 DIAGNOSIS — I10 Essential (primary) hypertension: Secondary | ICD-10-CM | POA: Diagnosis not present

## 2020-02-13 DIAGNOSIS — J45909 Unspecified asthma, uncomplicated: Secondary | ICD-10-CM | POA: Diagnosis not present

## 2020-02-13 DIAGNOSIS — E039 Hypothyroidism, unspecified: Secondary | ICD-10-CM | POA: Diagnosis not present

## 2020-02-13 DIAGNOSIS — E1169 Type 2 diabetes mellitus with other specified complication: Secondary | ICD-10-CM | POA: Diagnosis not present

## 2020-02-13 DIAGNOSIS — R350 Frequency of micturition: Secondary | ICD-10-CM | POA: Diagnosis not present

## 2020-02-13 DIAGNOSIS — G629 Polyneuropathy, unspecified: Secondary | ICD-10-CM | POA: Diagnosis not present

## 2020-02-14 DIAGNOSIS — N401 Enlarged prostate with lower urinary tract symptoms: Secondary | ICD-10-CM | POA: Diagnosis not present

## 2020-02-14 DIAGNOSIS — R350 Frequency of micturition: Secondary | ICD-10-CM | POA: Diagnosis not present

## 2020-02-14 DIAGNOSIS — E039 Hypothyroidism, unspecified: Secondary | ICD-10-CM | POA: Diagnosis not present

## 2020-02-14 DIAGNOSIS — I10 Essential (primary) hypertension: Secondary | ICD-10-CM | POA: Diagnosis not present

## 2020-02-14 DIAGNOSIS — J45909 Unspecified asthma, uncomplicated: Secondary | ICD-10-CM | POA: Diagnosis not present

## 2020-02-14 DIAGNOSIS — E7800 Pure hypercholesterolemia, unspecified: Secondary | ICD-10-CM | POA: Diagnosis not present

## 2020-02-14 DIAGNOSIS — E1169 Type 2 diabetes mellitus with other specified complication: Secondary | ICD-10-CM | POA: Diagnosis not present

## 2020-02-28 ENCOUNTER — Other Ambulatory Visit: Payer: Self-pay

## 2020-02-28 ENCOUNTER — Telehealth: Payer: Self-pay | Admitting: Physician Assistant

## 2020-02-28 DIAGNOSIS — N401 Enlarged prostate with lower urinary tract symptoms: Secondary | ICD-10-CM | POA: Diagnosis not present

## 2020-02-28 DIAGNOSIS — E1169 Type 2 diabetes mellitus with other specified complication: Secondary | ICD-10-CM | POA: Diagnosis not present

## 2020-02-28 DIAGNOSIS — E039 Hypothyroidism, unspecified: Secondary | ICD-10-CM | POA: Diagnosis not present

## 2020-02-28 DIAGNOSIS — I10 Essential (primary) hypertension: Secondary | ICD-10-CM | POA: Diagnosis not present

## 2020-02-28 DIAGNOSIS — E7800 Pure hypercholesterolemia, unspecified: Secondary | ICD-10-CM | POA: Diagnosis not present

## 2020-02-28 DIAGNOSIS — J45909 Unspecified asthma, uncomplicated: Secondary | ICD-10-CM | POA: Diagnosis not present

## 2020-02-28 MED ORDER — CYCLOBENZAPRINE HCL 10 MG PO TABS: 10.0000 mg | ORAL_TABLET | Freq: Three times a day (TID) | ORAL | 1 refills | Status: DC | PRN

## 2020-02-28 NOTE — Telephone Encounter (Signed)
[facility] called. Says patient needs a refill on Flexeril. The call back number is [phone]

## 2020-02-28 NOTE — Telephone Encounter (Signed)
Patient aware sent to pharmacy

## 2020-03-08 ENCOUNTER — Other Ambulatory Visit: Payer: Self-pay | Admitting: Orthopaedic Surgery

## 2020-03-08 NOTE — Telephone Encounter (Signed)
Pls advise.  

## 2020-04-16 DIAGNOSIS — J45909 Unspecified asthma, uncomplicated: Secondary | ICD-10-CM | POA: Diagnosis not present

## 2020-04-16 DIAGNOSIS — I10 Essential (primary) hypertension: Secondary | ICD-10-CM | POA: Diagnosis not present

## 2020-04-16 DIAGNOSIS — N401 Enlarged prostate with lower urinary tract symptoms: Secondary | ICD-10-CM | POA: Diagnosis not present

## 2020-04-16 DIAGNOSIS — E7800 Pure hypercholesterolemia, unspecified: Secondary | ICD-10-CM | POA: Diagnosis not present

## 2020-04-16 DIAGNOSIS — E1169 Type 2 diabetes mellitus with other specified complication: Secondary | ICD-10-CM | POA: Diagnosis not present

## 2020-04-16 DIAGNOSIS — E039 Hypothyroidism, unspecified: Secondary | ICD-10-CM | POA: Diagnosis not present

## 2020-04-18 DIAGNOSIS — Z20828 Contact with and (suspected) exposure to other viral communicable diseases: Secondary | ICD-10-CM | POA: Diagnosis not present

## 2020-05-16 DIAGNOSIS — E7800 Pure hypercholesterolemia, unspecified: Secondary | ICD-10-CM | POA: Diagnosis not present

## 2020-05-16 DIAGNOSIS — J45909 Unspecified asthma, uncomplicated: Secondary | ICD-10-CM | POA: Diagnosis not present

## 2020-05-16 DIAGNOSIS — E1169 Type 2 diabetes mellitus with other specified complication: Secondary | ICD-10-CM | POA: Diagnosis not present

## 2020-05-16 DIAGNOSIS — I10 Essential (primary) hypertension: Secondary | ICD-10-CM | POA: Diagnosis not present

## 2020-05-16 DIAGNOSIS — N401 Enlarged prostate with lower urinary tract symptoms: Secondary | ICD-10-CM | POA: Diagnosis not present

## 2020-05-16 DIAGNOSIS — E039 Hypothyroidism, unspecified: Secondary | ICD-10-CM | POA: Diagnosis not present

## 2020-05-23 DIAGNOSIS — N401 Enlarged prostate with lower urinary tract symptoms: Secondary | ICD-10-CM | POA: Diagnosis not present

## 2020-05-23 DIAGNOSIS — I10 Essential (primary) hypertension: Secondary | ICD-10-CM | POA: Diagnosis not present

## 2020-05-23 DIAGNOSIS — E1169 Type 2 diabetes mellitus with other specified complication: Secondary | ICD-10-CM | POA: Diagnosis not present

## 2020-05-23 DIAGNOSIS — J45909 Unspecified asthma, uncomplicated: Secondary | ICD-10-CM | POA: Diagnosis not present

## 2020-05-23 DIAGNOSIS — E7800 Pure hypercholesterolemia, unspecified: Secondary | ICD-10-CM | POA: Diagnosis not present

## 2020-05-23 DIAGNOSIS — E039 Hypothyroidism, unspecified: Secondary | ICD-10-CM | POA: Diagnosis not present

## 2020-08-06 DIAGNOSIS — N401 Enlarged prostate with lower urinary tract symptoms: Secondary | ICD-10-CM | POA: Diagnosis not present

## 2020-08-06 DIAGNOSIS — G629 Polyneuropathy, unspecified: Secondary | ICD-10-CM | POA: Diagnosis not present

## 2020-08-06 DIAGNOSIS — Z23 Encounter for immunization: Secondary | ICD-10-CM | POA: Diagnosis not present

## 2020-08-06 DIAGNOSIS — Z125 Encounter for screening for malignant neoplasm of prostate: Secondary | ICD-10-CM | POA: Diagnosis not present

## 2020-08-06 DIAGNOSIS — Z1389 Encounter for screening for other disorder: Secondary | ICD-10-CM | POA: Diagnosis not present

## 2020-08-06 DIAGNOSIS — E039 Hypothyroidism, unspecified: Secondary | ICD-10-CM | POA: Diagnosis not present

## 2020-08-06 DIAGNOSIS — K219 Gastro-esophageal reflux disease without esophagitis: Secondary | ICD-10-CM | POA: Diagnosis not present

## 2020-08-06 DIAGNOSIS — I10 Essential (primary) hypertension: Secondary | ICD-10-CM | POA: Diagnosis not present

## 2020-08-06 DIAGNOSIS — J45909 Unspecified asthma, uncomplicated: Secondary | ICD-10-CM | POA: Diagnosis not present

## 2020-08-06 DIAGNOSIS — Z0000 Encounter for general adult medical examination without abnormal findings: Secondary | ICD-10-CM | POA: Diagnosis not present

## 2020-08-06 DIAGNOSIS — Z86010 Personal history of colonic polyps: Secondary | ICD-10-CM | POA: Diagnosis not present

## 2020-08-06 DIAGNOSIS — G4733 Obstructive sleep apnea (adult) (pediatric): Secondary | ICD-10-CM | POA: Diagnosis not present

## 2020-08-06 DIAGNOSIS — E1169 Type 2 diabetes mellitus with other specified complication: Secondary | ICD-10-CM | POA: Diagnosis not present

## 2020-08-06 DIAGNOSIS — E7800 Pure hypercholesterolemia, unspecified: Secondary | ICD-10-CM | POA: Diagnosis not present

## 2020-09-04 DIAGNOSIS — H16223 Keratoconjunctivitis sicca, not specified as Sjogren's, bilateral: Secondary | ICD-10-CM | POA: Diagnosis not present

## 2020-09-12 DIAGNOSIS — J45909 Unspecified asthma, uncomplicated: Secondary | ICD-10-CM | POA: Diagnosis not present

## 2020-09-12 DIAGNOSIS — K219 Gastro-esophageal reflux disease without esophagitis: Secondary | ICD-10-CM | POA: Diagnosis not present

## 2020-09-12 DIAGNOSIS — I10 Essential (primary) hypertension: Secondary | ICD-10-CM | POA: Diagnosis not present

## 2020-09-12 DIAGNOSIS — N401 Enlarged prostate with lower urinary tract symptoms: Secondary | ICD-10-CM | POA: Diagnosis not present

## 2020-09-12 DIAGNOSIS — E039 Hypothyroidism, unspecified: Secondary | ICD-10-CM | POA: Diagnosis not present

## 2020-09-12 DIAGNOSIS — E7800 Pure hypercholesterolemia, unspecified: Secondary | ICD-10-CM | POA: Diagnosis not present

## 2020-09-12 DIAGNOSIS — G8929 Other chronic pain: Secondary | ICD-10-CM | POA: Diagnosis not present

## 2020-09-12 DIAGNOSIS — E1169 Type 2 diabetes mellitus with other specified complication: Secondary | ICD-10-CM | POA: Diagnosis not present

## 2020-09-16 DIAGNOSIS — Z96651 Presence of right artificial knee joint: Secondary | ICD-10-CM | POA: Diagnosis not present

## 2020-09-16 DIAGNOSIS — Z471 Aftercare following joint replacement surgery: Secondary | ICD-10-CM | POA: Diagnosis not present

## 2020-09-16 DIAGNOSIS — Z6841 Body Mass Index (BMI) 40.0 and over, adult: Secondary | ICD-10-CM | POA: Diagnosis not present

## 2020-09-19 DIAGNOSIS — H16223 Keratoconjunctivitis sicca, not specified as Sjogren's, bilateral: Secondary | ICD-10-CM | POA: Diagnosis not present

## 2020-09-19 LAB — HM DIABETES EYE EXAM

## 2020-09-26 DIAGNOSIS — M25661 Stiffness of right knee, not elsewhere classified: Secondary | ICD-10-CM | POA: Diagnosis not present

## 2020-09-26 DIAGNOSIS — M25651 Stiffness of right hip, not elsewhere classified: Secondary | ICD-10-CM | POA: Diagnosis not present

## 2020-09-30 DIAGNOSIS — M25651 Stiffness of right hip, not elsewhere classified: Secondary | ICD-10-CM | POA: Diagnosis not present

## 2020-09-30 DIAGNOSIS — M25661 Stiffness of right knee, not elsewhere classified: Secondary | ICD-10-CM | POA: Diagnosis not present

## 2020-10-02 DIAGNOSIS — H16223 Keratoconjunctivitis sicca, not specified as Sjogren's, bilateral: Secondary | ICD-10-CM | POA: Diagnosis not present

## 2020-10-03 DIAGNOSIS — M25661 Stiffness of right knee, not elsewhere classified: Secondary | ICD-10-CM | POA: Diagnosis not present

## 2020-10-03 DIAGNOSIS — M25651 Stiffness of right hip, not elsewhere classified: Secondary | ICD-10-CM | POA: Diagnosis not present

## 2020-10-08 DIAGNOSIS — M25661 Stiffness of right knee, not elsewhere classified: Secondary | ICD-10-CM | POA: Diagnosis not present

## 2020-10-08 DIAGNOSIS — M26651 Arthropathy of right temporomandibular joint: Secondary | ICD-10-CM | POA: Diagnosis not present

## 2020-10-11 DIAGNOSIS — M25661 Stiffness of right knee, not elsewhere classified: Secondary | ICD-10-CM | POA: Diagnosis not present

## 2020-10-11 DIAGNOSIS — M25651 Stiffness of right hip, not elsewhere classified: Secondary | ICD-10-CM | POA: Diagnosis not present

## 2020-10-15 DIAGNOSIS — M25661 Stiffness of right knee, not elsewhere classified: Secondary | ICD-10-CM | POA: Diagnosis not present

## 2020-10-15 DIAGNOSIS — M25651 Stiffness of right hip, not elsewhere classified: Secondary | ICD-10-CM | POA: Diagnosis not present

## 2020-10-17 DIAGNOSIS — M25661 Stiffness of right knee, not elsewhere classified: Secondary | ICD-10-CM | POA: Diagnosis not present

## 2020-10-17 DIAGNOSIS — M25651 Stiffness of right hip, not elsewhere classified: Secondary | ICD-10-CM | POA: Diagnosis not present

## 2020-10-21 DIAGNOSIS — M25661 Stiffness of right knee, not elsewhere classified: Secondary | ICD-10-CM | POA: Diagnosis not present

## 2020-10-21 DIAGNOSIS — M25651 Stiffness of right hip, not elsewhere classified: Secondary | ICD-10-CM | POA: Diagnosis not present

## 2020-10-28 DIAGNOSIS — M25661 Stiffness of right knee, not elsewhere classified: Secondary | ICD-10-CM | POA: Diagnosis not present

## 2020-10-28 DIAGNOSIS — M25651 Stiffness of right hip, not elsewhere classified: Secondary | ICD-10-CM | POA: Diagnosis not present

## 2020-10-30 DIAGNOSIS — M25551 Pain in right hip: Secondary | ICD-10-CM | POA: Diagnosis not present

## 2020-11-15 DIAGNOSIS — K219 Gastro-esophageal reflux disease without esophagitis: Secondary | ICD-10-CM | POA: Diagnosis not present

## 2020-11-15 DIAGNOSIS — J4521 Mild intermittent asthma with (acute) exacerbation: Secondary | ICD-10-CM | POA: Diagnosis not present

## 2020-12-10 DIAGNOSIS — J45909 Unspecified asthma, uncomplicated: Secondary | ICD-10-CM | POA: Diagnosis not present

## 2020-12-10 DIAGNOSIS — I10 Essential (primary) hypertension: Secondary | ICD-10-CM | POA: Diagnosis not present

## 2020-12-10 DIAGNOSIS — G629 Polyneuropathy, unspecified: Secondary | ICD-10-CM | POA: Diagnosis not present

## 2020-12-10 DIAGNOSIS — E1169 Type 2 diabetes mellitus with other specified complication: Secondary | ICD-10-CM | POA: Diagnosis not present

## 2020-12-10 DIAGNOSIS — K219 Gastro-esophageal reflux disease without esophagitis: Secondary | ICD-10-CM | POA: Diagnosis not present

## 2020-12-25 DIAGNOSIS — M25651 Stiffness of right hip, not elsewhere classified: Secondary | ICD-10-CM | POA: Diagnosis not present

## 2020-12-25 DIAGNOSIS — M25551 Pain in right hip: Secondary | ICD-10-CM | POA: Diagnosis not present

## 2020-12-25 DIAGNOSIS — M4726 Other spondylosis with radiculopathy, lumbar region: Secondary | ICD-10-CM | POA: Diagnosis not present

## 2021-01-01 DIAGNOSIS — M4726 Other spondylosis with radiculopathy, lumbar region: Secondary | ICD-10-CM | POA: Diagnosis not present

## 2021-01-01 DIAGNOSIS — M25551 Pain in right hip: Secondary | ICD-10-CM | POA: Diagnosis not present

## 2021-01-01 DIAGNOSIS — M25651 Stiffness of right hip, not elsewhere classified: Secondary | ICD-10-CM | POA: Diagnosis not present

## 2021-01-03 DIAGNOSIS — M4726 Other spondylosis with radiculopathy, lumbar region: Secondary | ICD-10-CM | POA: Diagnosis not present

## 2021-01-03 DIAGNOSIS — M25551 Pain in right hip: Secondary | ICD-10-CM | POA: Diagnosis not present

## 2021-01-03 DIAGNOSIS — M25651 Stiffness of right hip, not elsewhere classified: Secondary | ICD-10-CM | POA: Diagnosis not present

## 2021-01-06 DIAGNOSIS — M4726 Other spondylosis with radiculopathy, lumbar region: Secondary | ICD-10-CM | POA: Diagnosis not present

## 2021-01-06 DIAGNOSIS — M25551 Pain in right hip: Secondary | ICD-10-CM | POA: Diagnosis not present

## 2021-01-06 DIAGNOSIS — M25651 Stiffness of right hip, not elsewhere classified: Secondary | ICD-10-CM | POA: Diagnosis not present

## 2021-01-08 DIAGNOSIS — M4726 Other spondylosis with radiculopathy, lumbar region: Secondary | ICD-10-CM | POA: Diagnosis not present

## 2021-01-08 DIAGNOSIS — M25551 Pain in right hip: Secondary | ICD-10-CM | POA: Diagnosis not present

## 2021-01-08 DIAGNOSIS — M25651 Stiffness of right hip, not elsewhere classified: Secondary | ICD-10-CM | POA: Diagnosis not present

## 2021-01-10 DIAGNOSIS — E7800 Pure hypercholesterolemia, unspecified: Secondary | ICD-10-CM | POA: Diagnosis not present

## 2021-01-10 DIAGNOSIS — J4521 Mild intermittent asthma with (acute) exacerbation: Secondary | ICD-10-CM | POA: Diagnosis not present

## 2021-01-10 DIAGNOSIS — E1169 Type 2 diabetes mellitus with other specified complication: Secondary | ICD-10-CM | POA: Diagnosis not present

## 2021-01-10 DIAGNOSIS — E039 Hypothyroidism, unspecified: Secondary | ICD-10-CM | POA: Diagnosis not present

## 2021-01-10 DIAGNOSIS — I10 Essential (primary) hypertension: Secondary | ICD-10-CM | POA: Diagnosis not present

## 2021-01-10 DIAGNOSIS — G8929 Other chronic pain: Secondary | ICD-10-CM | POA: Diagnosis not present

## 2021-01-10 DIAGNOSIS — N401 Enlarged prostate with lower urinary tract symptoms: Secondary | ICD-10-CM | POA: Diagnosis not present

## 2021-01-10 DIAGNOSIS — K219 Gastro-esophageal reflux disease without esophagitis: Secondary | ICD-10-CM | POA: Diagnosis not present

## 2021-01-10 DIAGNOSIS — J45909 Unspecified asthma, uncomplicated: Secondary | ICD-10-CM | POA: Diagnosis not present

## 2021-01-17 DIAGNOSIS — M4726 Other spondylosis with radiculopathy, lumbar region: Secondary | ICD-10-CM | POA: Diagnosis not present

## 2021-01-17 DIAGNOSIS — M25551 Pain in right hip: Secondary | ICD-10-CM | POA: Diagnosis not present

## 2021-01-17 DIAGNOSIS — M25651 Stiffness of right hip, not elsewhere classified: Secondary | ICD-10-CM | POA: Diagnosis not present

## 2021-01-20 DIAGNOSIS — M25551 Pain in right hip: Secondary | ICD-10-CM | POA: Diagnosis not present

## 2021-01-20 DIAGNOSIS — M25651 Stiffness of right hip, not elsewhere classified: Secondary | ICD-10-CM | POA: Diagnosis not present

## 2021-01-20 DIAGNOSIS — M4726 Other spondylosis with radiculopathy, lumbar region: Secondary | ICD-10-CM | POA: Diagnosis not present

## 2021-01-22 DIAGNOSIS — M1611 Unilateral primary osteoarthritis, right hip: Secondary | ICD-10-CM | POA: Diagnosis not present

## 2021-01-23 DIAGNOSIS — M4726 Other spondylosis with radiculopathy, lumbar region: Secondary | ICD-10-CM | POA: Diagnosis not present

## 2021-01-23 DIAGNOSIS — M25651 Stiffness of right hip, not elsewhere classified: Secondary | ICD-10-CM | POA: Diagnosis not present

## 2021-01-23 DIAGNOSIS — M25551 Pain in right hip: Secondary | ICD-10-CM | POA: Diagnosis not present

## 2021-01-27 DIAGNOSIS — M4726 Other spondylosis with radiculopathy, lumbar region: Secondary | ICD-10-CM | POA: Diagnosis not present

## 2021-01-27 DIAGNOSIS — M25651 Stiffness of right hip, not elsewhere classified: Secondary | ICD-10-CM | POA: Diagnosis not present

## 2021-01-27 DIAGNOSIS — M25551 Pain in right hip: Secondary | ICD-10-CM | POA: Diagnosis not present

## 2021-01-31 DIAGNOSIS — M25551 Pain in right hip: Secondary | ICD-10-CM | POA: Diagnosis not present

## 2021-01-31 DIAGNOSIS — M4726 Other spondylosis with radiculopathy, lumbar region: Secondary | ICD-10-CM | POA: Diagnosis not present

## 2021-01-31 DIAGNOSIS — M25651 Stiffness of right hip, not elsewhere classified: Secondary | ICD-10-CM | POA: Diagnosis not present

## 2021-02-03 DIAGNOSIS — M25651 Stiffness of right hip, not elsewhere classified: Secondary | ICD-10-CM | POA: Diagnosis not present

## 2021-02-03 DIAGNOSIS — M25551 Pain in right hip: Secondary | ICD-10-CM | POA: Diagnosis not present

## 2021-02-03 DIAGNOSIS — M4726 Other spondylosis with radiculopathy, lumbar region: Secondary | ICD-10-CM | POA: Diagnosis not present

## 2021-02-11 DIAGNOSIS — M4726 Other spondylosis with radiculopathy, lumbar region: Secondary | ICD-10-CM | POA: Diagnosis not present

## 2021-02-11 DIAGNOSIS — M25551 Pain in right hip: Secondary | ICD-10-CM | POA: Diagnosis not present

## 2021-02-11 DIAGNOSIS — M25651 Stiffness of right hip, not elsewhere classified: Secondary | ICD-10-CM | POA: Diagnosis not present

## 2021-02-13 DIAGNOSIS — G4733 Obstructive sleep apnea (adult) (pediatric): Secondary | ICD-10-CM | POA: Diagnosis not present

## 2021-02-14 DIAGNOSIS — M25551 Pain in right hip: Secondary | ICD-10-CM | POA: Diagnosis not present

## 2021-02-14 DIAGNOSIS — M4726 Other spondylosis with radiculopathy, lumbar region: Secondary | ICD-10-CM | POA: Diagnosis not present

## 2021-02-14 DIAGNOSIS — M25651 Stiffness of right hip, not elsewhere classified: Secondary | ICD-10-CM | POA: Diagnosis not present

## 2021-02-18 DIAGNOSIS — M4726 Other spondylosis with radiculopathy, lumbar region: Secondary | ICD-10-CM | POA: Diagnosis not present

## 2021-02-18 DIAGNOSIS — M25651 Stiffness of right hip, not elsewhere classified: Secondary | ICD-10-CM | POA: Diagnosis not present

## 2021-02-18 DIAGNOSIS — M25551 Pain in right hip: Secondary | ICD-10-CM | POA: Diagnosis not present

## 2021-02-20 DIAGNOSIS — M25651 Stiffness of right hip, not elsewhere classified: Secondary | ICD-10-CM | POA: Diagnosis not present

## 2021-02-20 DIAGNOSIS — M25551 Pain in right hip: Secondary | ICD-10-CM | POA: Diagnosis not present

## 2021-02-20 DIAGNOSIS — M4726 Other spondylosis with radiculopathy, lumbar region: Secondary | ICD-10-CM | POA: Diagnosis not present

## 2021-02-26 DIAGNOSIS — M25551 Pain in right hip: Secondary | ICD-10-CM | POA: Diagnosis not present

## 2021-02-26 DIAGNOSIS — M25651 Stiffness of right hip, not elsewhere classified: Secondary | ICD-10-CM | POA: Diagnosis not present

## 2021-02-26 DIAGNOSIS — M4726 Other spondylosis with radiculopathy, lumbar region: Secondary | ICD-10-CM | POA: Diagnosis not present

## 2021-02-27 DIAGNOSIS — M25551 Pain in right hip: Secondary | ICD-10-CM | POA: Diagnosis not present

## 2021-02-27 DIAGNOSIS — M4726 Other spondylosis with radiculopathy, lumbar region: Secondary | ICD-10-CM | POA: Diagnosis not present

## 2021-02-27 DIAGNOSIS — M25651 Stiffness of right hip, not elsewhere classified: Secondary | ICD-10-CM | POA: Diagnosis not present

## 2021-03-04 DIAGNOSIS — M25551 Pain in right hip: Secondary | ICD-10-CM | POA: Diagnosis not present

## 2021-03-04 DIAGNOSIS — M25651 Stiffness of right hip, not elsewhere classified: Secondary | ICD-10-CM | POA: Diagnosis not present

## 2021-03-04 DIAGNOSIS — M4726 Other spondylosis with radiculopathy, lumbar region: Secondary | ICD-10-CM | POA: Diagnosis not present

## 2021-03-06 DIAGNOSIS — M25551 Pain in right hip: Secondary | ICD-10-CM | POA: Diagnosis not present

## 2021-03-06 DIAGNOSIS — M4726 Other spondylosis with radiculopathy, lumbar region: Secondary | ICD-10-CM | POA: Diagnosis not present

## 2021-03-06 DIAGNOSIS — M25651 Stiffness of right hip, not elsewhere classified: Secondary | ICD-10-CM | POA: Diagnosis not present

## 2021-03-11 DIAGNOSIS — M4726 Other spondylosis with radiculopathy, lumbar region: Secondary | ICD-10-CM | POA: Diagnosis not present

## 2021-03-11 DIAGNOSIS — M25651 Stiffness of right hip, not elsewhere classified: Secondary | ICD-10-CM | POA: Diagnosis not present

## 2021-03-11 DIAGNOSIS — M25551 Pain in right hip: Secondary | ICD-10-CM | POA: Diagnosis not present

## 2021-03-11 DIAGNOSIS — E1169 Type 2 diabetes mellitus with other specified complication: Secondary | ICD-10-CM | POA: Diagnosis not present

## 2021-03-27 DIAGNOSIS — G4733 Obstructive sleep apnea (adult) (pediatric): Secondary | ICD-10-CM | POA: Diagnosis not present

## 2021-04-09 DIAGNOSIS — E1142 Type 2 diabetes mellitus with diabetic polyneuropathy: Secondary | ICD-10-CM | POA: Diagnosis not present

## 2021-04-16 DIAGNOSIS — E7800 Pure hypercholesterolemia, unspecified: Secondary | ICD-10-CM | POA: Diagnosis not present

## 2021-04-16 DIAGNOSIS — J4521 Mild intermittent asthma with (acute) exacerbation: Secondary | ICD-10-CM | POA: Diagnosis not present

## 2021-04-16 DIAGNOSIS — K219 Gastro-esophageal reflux disease without esophagitis: Secondary | ICD-10-CM | POA: Diagnosis not present

## 2021-04-16 DIAGNOSIS — N401 Enlarged prostate with lower urinary tract symptoms: Secondary | ICD-10-CM | POA: Diagnosis not present

## 2021-04-16 DIAGNOSIS — I10 Essential (primary) hypertension: Secondary | ICD-10-CM | POA: Diagnosis not present

## 2021-04-16 DIAGNOSIS — E1169 Type 2 diabetes mellitus with other specified complication: Secondary | ICD-10-CM | POA: Diagnosis not present

## 2021-04-16 DIAGNOSIS — E039 Hypothyroidism, unspecified: Secondary | ICD-10-CM | POA: Diagnosis not present

## 2021-04-16 DIAGNOSIS — J45909 Unspecified asthma, uncomplicated: Secondary | ICD-10-CM | POA: Diagnosis not present

## 2021-04-16 DIAGNOSIS — G8929 Other chronic pain: Secondary | ICD-10-CM | POA: Diagnosis not present

## 2021-05-29 ENCOUNTER — Ambulatory Visit: Payer: PPO | Admitting: Allergy

## 2021-05-29 ENCOUNTER — Other Ambulatory Visit: Payer: Self-pay

## 2021-05-29 VITALS — BP 130/70 | HR 87 | Temp 98.8°F | Resp 16 | Ht 72.0 in | Wt 305.2 lb

## 2021-05-29 DIAGNOSIS — K219 Gastro-esophageal reflux disease without esophagitis: Secondary | ICD-10-CM | POA: Diagnosis not present

## 2021-05-29 DIAGNOSIS — R0982 Postnasal drip: Secondary | ICD-10-CM

## 2021-05-29 DIAGNOSIS — J310 Chronic rhinitis: Secondary | ICD-10-CM | POA: Diagnosis not present

## 2021-05-29 DIAGNOSIS — J4520 Mild intermittent asthma, uncomplicated: Secondary | ICD-10-CM | POA: Diagnosis not present
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
## 2021-05-29 NOTE — Patient Instructions (Addendum)
Rhinitis - increase in nasal most likely nasal drainage as you are on an adequate reflux regimen with Esomeprazole and famotidine - increase Astelin to 2 sprays each nostril twice a day.  Let us know next week if your drainage is improved or not.  If not improved then would recommend nasal Atrovent to use to improve drainage - continue Xyzal 5mg  daily.  This is a long-acting antihistamine - continue Montelukast 10mg  daily - if symptoms not improving with above regimen then would recommend skin testing to environmental allergens.  Will need to hold Xyzal for at least 3 days prior to any skin testing visit  Reflux - continue your current regimen with daily Famotidine and Esomeprazole  Asthma - well controlled at this time - you picked out Arnuity inhaler from the inhaler chart.  Arnuity is a maintenance asthma control medication.  Continue 1 puff twice a day for control.    - have access to albuterol inhaler 2 puffs every 4-6 hours as needed for cough/wheeze/shortness of breath/chest tightness.  May use 15-20 minutes prior to activity.   Monitor frequency of use.    Control goals:  Full participation in all desired activities (may need albuterol before activity) Albuterol use two time or less a week on average (not counting use with activity) Cough interfering with sleep two time or less a month Oral steroids no more than once a year No hospitalizations  Return in about 3 months. Call us in a week with update on drainage

## 2021-05-30 ENCOUNTER — Encounter: Payer: Self-pay | Admitting: Allergy

## 2021-05-30 DIAGNOSIS — J45909 Unspecified asthma, uncomplicated: Secondary | ICD-10-CM | POA: Diagnosis not present

## 2021-05-30 DIAGNOSIS — J3089 Other allergic rhinitis: Secondary | ICD-10-CM | POA: Diagnosis not present

## 2021-05-30 DIAGNOSIS — Z23 Encounter for immunization: Secondary | ICD-10-CM | POA: Diagnosis not present

## 2021-05-30 DIAGNOSIS — K219 Gastro-esophageal reflux disease without esophagitis: Secondary | ICD-10-CM | POA: Diagnosis not present

## 2021-05-30 DIAGNOSIS — E1169 Type 2 diabetes mellitus with other specified complication: Secondary | ICD-10-CM | POA: Diagnosis not present

## 2021-05-30 DIAGNOSIS — I10 Essential (primary) hypertension: Secondary | ICD-10-CM | POA: Diagnosis not present

## 2021-08-21 ENCOUNTER — Other Ambulatory Visit: Payer: Self-pay

## 2021-08-21 ENCOUNTER — Ambulatory Visit (INDEPENDENT_AMBULATORY_CARE_PROVIDER_SITE_OTHER): Payer: PPO | Admitting: Allergy

## 2021-08-21 ENCOUNTER — Encounter: Payer: Self-pay | Admitting: Allergy

## 2021-08-21 VITALS — BP 160/80 | HR 82 | Temp 98.0°F | Resp 18 | Ht 72.0 in | Wt 309.0 lb

## 2021-08-21 DIAGNOSIS — J310 Chronic rhinitis: Secondary | ICD-10-CM

## 2021-08-21 DIAGNOSIS — J45909 Unspecified asthma, uncomplicated: Secondary | ICD-10-CM | POA: Diagnosis not present

## 2021-08-21 DIAGNOSIS — I10 Essential (primary) hypertension: Secondary | ICD-10-CM | POA: Diagnosis not present

## 2021-08-21 DIAGNOSIS — K219 Gastro-esophageal reflux disease without esophagitis: Secondary | ICD-10-CM | POA: Diagnosis not present

## 2021-08-21 DIAGNOSIS — Z1389 Encounter for screening for other disorder: Secondary | ICD-10-CM | POA: Diagnosis not present

## 2021-08-21 DIAGNOSIS — Z0000 Encounter for general adult medical examination without abnormal findings: Secondary | ICD-10-CM | POA: Diagnosis not present

## 2021-08-21 DIAGNOSIS — E039 Hypothyroidism, unspecified: Secondary | ICD-10-CM | POA: Diagnosis not present

## 2021-08-21 DIAGNOSIS — R0982 Postnasal drip: Secondary | ICD-10-CM

## 2021-08-21 DIAGNOSIS — J4520 Mild intermittent asthma, uncomplicated: Secondary | ICD-10-CM

## 2021-08-21 DIAGNOSIS — N401 Enlarged prostate with lower urinary tract symptoms: Secondary | ICD-10-CM | POA: Diagnosis not present

## 2021-08-21 DIAGNOSIS — G629 Polyneuropathy, unspecified: Secondary | ICD-10-CM | POA: Diagnosis not present

## 2021-08-21 DIAGNOSIS — J3089 Other allergic rhinitis: Secondary | ICD-10-CM | POA: Diagnosis not present

## 2021-08-21 DIAGNOSIS — E7800 Pure hypercholesterolemia, unspecified: Secondary | ICD-10-CM | POA: Diagnosis not present

## 2021-08-21 DIAGNOSIS — E1169 Type 2 diabetes mellitus with other specified complication: Secondary | ICD-10-CM | POA: Diagnosis not present

## 2021-08-21 DIAGNOSIS — G4733 Obstructive sleep apnea (adult) (pediatric): Secondary | ICD-10-CM | POA: Diagnosis not present

## 2021-08-21 MED ORDER — IPRATROPIUM BROMIDE 0.06 % NA SOLN: 2.0000 | Freq: Two times a day (BID) | NASAL | 5 refills | Status: DC

## 2021-08-21 MED ORDER — CARBINOXAMINE MALEATE 6 MG PO TABS: 1.0000 | ORAL_TABLET | Freq: Two times a day (BID) | ORAL | 1 refills | Status: DC

## 2021-08-21 MED ORDER — ARNUITY ELLIPTA 200 MCG/ACT IN AEPB: 1.0000 | INHALATION_SPRAY | Freq: Every day | RESPIRATORY_TRACT | 5 refills | Status: DC

## 2021-08-21 NOTE — Patient Instructions (Addendum)
Rhinitis - stop Astelin - start nasal Atrovent 0.06% 2 sprays each nostril twice a day (can use additional 2 more times a day for 4 usages as needed) for nasal drainage control.  If this is ineffective then let us know and would recommend you see Dr. Erik Obey with ENT for evaluation - stop Xyzal - start Ryvent (carbinaxomine) 4-6mg  1 tab twice a day.  This is an antihistamine that is prescription based - continue Montelukast 10mg  daily - if symptoms not improving with above regimen then would recommend skin testing to environmental allergens.  Will need to hold antihistamine for at least 3 days prior to any skin testing visit  Reflux - continue your current regimen with daily Famotidine and Esomeprazole.  If above changes in not effective then would recommend trial of different anti-reflux medication than esomeprazole  Asthma - lung function today is low - increase to Arnuity 221mcg inhaler 1 puff once a day.   Arnuity is a maintenance asthma control medication.  - have access to albuterol inhaler 2 puffs every 4-6 hours as needed for cough/wheeze/shortness of breath/chest tightness.  May use 15-20 minutes prior to activity.   Monitor frequency of use.    Control goals:  Full participation in all desired activities (may need albuterol before activity) Albuterol use two time or less a week on average (not counting use with activity) Cough interfering with sleep two time or less a month Oral steroids no more than once a year No hospitalizations  Return in about 3-4 months.

## 2021-08-21 NOTE — Progress Notes (Signed)
Follow-up Note  RE: James Salvucci Ruder Sr. MRN: [id number] DOB: [birth year] Date of Office Visit: 08/21/2021   History of present illness: James Sikora Rathgeber Sr. is a 71 y.o. male presenting today for follow-up of allergic rhinitis, reflux and asthma.  He was last seen in the office on 05/29/2021 by myself.  He feels his symptoms are worse than they were at initial visit.  He is having a lots of nasal drainage, congestion, wheeze, difficulty breathing.  He states his PCP treated him with Augmentin course.  With this he states his drainage changed from green to clear but it did not change the amount he is producing.  He is constantly needing to cough and clear his throat.  He did increase the Astelin to 2 sprays twice a day but this has not helped.  He does report taking Xyzal and montelukast.  Again not sure if these are effective.  For reflux control he takes famotidine and esomeprazole daily.  He does not report frank reflux symptoms.  For his asthma control he is doing Arnuity 100 mcg 1 puff daily.  Even though he reports respiratory symptoms he states he has not use his albuterol.  Review of systems: Review of Systems  Constitutional: Negative.   HENT:  Positive for congestion and postnasal drip.   Eyes: Negative.   Respiratory:  Positive for cough, shortness of breath and wheezing.   Cardiovascular: Negative.   Musculoskeletal: Negative.   Skin: Negative.   Allergic/Immunologic: Negative.   Neurological: Negative.     All other systems negative unless noted above in HPI  Past medical/social/surgical/family history have been reviewed and are unchanged unless specifically indicated below.  No changes  Medication List: Current Outpatient Medications  Medication Sig Dispense Refill   atorvastatin (LIPITOR) 40 MG tablet TK 1 T PO  D  3   Beclomethasone Dipropionate (QNASL) 80 MCG/ACT AERS USE 1 SPRAY IN THE NOSE TWICE DAILY 8.7 g 5   Carbinoxamine Maleate (RYVENT) 6 MG  TABS Take 1 tablet by mouth 2 (two) times daily. 120 tablet 1   esomeprazole (NEXIUM) 40 MG capsule Take 40 mg by mouth daily at 12 noon.     famotidine (PEPCID) 10 MG tablet Take 10 mg by mouth 2 (two) times daily.     Fluticasone Furoate (ARNUITY ELLIPTA) 200 MCG/ACT AEPB Inhale 1 puff into the lungs daily. 30 each 5   gabapentin (NEURONTIN) 600 MG tablet   3   ipratropium (ATROVENT) 0.06 % nasal spray Place 2 sprays into both nostrils 2 (two) times daily. 15 mL 5   levothyroxine (SYNTHROID, LEVOTHROID) 200 MCG tablet Take 1 tablet by mouth daily. To be taken with a 25 mcg tablet to = 225 mcg qd  9   levothyroxine (SYNTHROID, LEVOTHROID) 50 MCG tablet TK 1 T PO  QAM ON AN EMPTY STOMACH  3   losartan (COZAAR) 100 MG tablet Take 100 mg by mouth daily.     metFORMIN (GLUCOPHAGE) 500 MG tablet TAKE 1 TABLET(500 MG) BY MOUTH DAILY WITH BREAKFAST 30 tablet 0   montelukast (SINGULAIR) 10 MG tablet Take 10 mg by mouth at bedtime.     Pregabalin (LYRICA PO) Take by mouth.     tiZANidine (ZANAFLEX) 4 MG tablet Take 1 tablet (4 mg total) by mouth 3 (three) times daily as needed for muscle spasms. 40 tablet 0   Vitamin D, Ergocalciferol, (DRISDOL) 1.25 MG (50000 UT) CAPS capsule Take 1 capsule (50,000 Units total) by mouth every  7 (seven) days. 4 capsule 0   No current facility-administered medications for this visit.     Known medication allergies: Allergies  Allergen Reactions   No Known Allergies      Physical examination: Blood pressure (!) 160/80, pulse 82, temperature 98 F (36.7 C), resp. rate 18, height 6' (1.829 m), weight (!) 309 lb (140.2 kg), SpO2 96 %.  General: Alert, interactive, in no acute distress. HEENT: PERRLA, TMs pearly gray, turbinates moderately edematous with clear discharge, post-pharynx non erythematous. Neck: Supple without lymphadenopathy. Lungs: Clear to auscultation without wheezing, rhonchi or rales. {no increased work of breathing. CV: Normal S1, S2 without  murmurs. Abdomen: Nondistended, nontender. Skin: Warm and dry, without lesions or rashes. Extremities:  No clubbing, cyanosis or edema. Neuro:   Grossly intact.  Diagnositics/Labs:  Spirometry: FEV1: 1.7L 51%, FVC: 2.6L 58%, ratio consistent with restrictive pattern.  Assessment and plan: Rhinitis - stop Astelin - start nasal Atrovent 0.06% 2 sprays each nostril twice a day (can use additional 2 more times a day for 4 usages as needed) for nasal drainage control.  If this is ineffective then let us know and would recommend you see Dr. Erik Obey with ENT for evaluation - stop Xyzal - start Ryvent (carbinaxomine) 4-6mg  1 tab twice a day.  This is an antihistamine that is prescription based - continue Montelukast 10mg  daily - if symptoms not improving with above regimen then would recommend skin testing to environmental allergens.  Will need to hold antihistamine for at least 3 days prior to any skin testing visit  Reflux - continue your current regimen with daily Famotidine and Esomeprazole.  If above changes in not effective then would recommend trial of different anti-reflux medication than esomeprazole  Asthma - lung function today is low - increase to Arnuity 251mcg inhaler 1 puff once a day.   Arnuity is a maintenance asthma control medication.  - have access to albuterol inhaler 2 puffs every 4-6 hours as needed for cough/wheeze/shortness of breath/chest tightness.  May use 15-20 minutes prior to activity.   Monitor frequency of use.    Control goals:  Full participation in all desired activities (may need albuterol before activity) Albuterol use two time or less a week on average (not counting use with activity) Cough interfering with sleep two time or less a month Oral steroids no more than once a year No hospitalizations  Return in about 3-4 months.     I appreciate the opportunity to take part in [name]'s care. Please do not hesitate to contact me with  questions.  Sincerely,   Prudy Feeler, MD Allergy/Immunology [facility] [place] [facility] [place]

## 2021-08-22 ENCOUNTER — Other Ambulatory Visit: Payer: Self-pay | Admitting: *Deleted

## 2021-08-26 ENCOUNTER — Telehealth: Payer: Self-pay | Admitting: *Deleted

## 2021-08-26 NOTE — Telephone Encounter (Signed)
Patient called to request prior authorization to be completed for his ryvent. I did advise patient a PA had been completed and is pending approval/denial.   Patient verbalized understanding and is requesting a call once PA has been approved/denied  Best contact number: [phone]

## 2021-08-26 NOTE — Telephone Encounter (Signed)
PA is still currently pending. Will definitely call the patient with an update a determination is received from insurance.

## 2021-08-26 NOTE — Telephone Encounter (Signed)
PA has been submitted through CoverMyMeds for Ryvent and is currently pending approval/denial. ID [id number], BIN [id number], PCN [id number],

## 2021-08-28 NOTE — Telephone Encounter (Signed)
PA has been approved for Ryvent. PA has been faxed to patients pharmacy, labeled, and placed in bulk scanning. Called patient and advised, patient verbalized understanding.

## 2021-09-15 DIAGNOSIS — J4521 Mild intermittent asthma with (acute) exacerbation: Secondary | ICD-10-CM | POA: Diagnosis not present

## 2021-09-15 DIAGNOSIS — J45909 Unspecified asthma, uncomplicated: Secondary | ICD-10-CM | POA: Diagnosis not present

## 2021-09-15 DIAGNOSIS — E039 Hypothyroidism, unspecified: Secondary | ICD-10-CM | POA: Diagnosis not present

## 2021-09-15 DIAGNOSIS — E7800 Pure hypercholesterolemia, unspecified: Secondary | ICD-10-CM | POA: Diagnosis not present

## 2021-09-15 DIAGNOSIS — I10 Essential (primary) hypertension: Secondary | ICD-10-CM | POA: Diagnosis not present

## 2021-09-15 DIAGNOSIS — G8929 Other chronic pain: Secondary | ICD-10-CM | POA: Diagnosis not present

## 2021-09-16 ENCOUNTER — Telehealth: Payer: Self-pay

## 2021-09-16 ENCOUNTER — Other Ambulatory Visit: Payer: Self-pay | Admitting: *Deleted

## 2021-09-16 MED ORDER — ARNUITY ELLIPTA 200 MCG/ACT IN AEPB: 1.0000 | INHALATION_SPRAY | Freq: Every day | RESPIRATORY_TRACT | 5 refills | Status: DC

## 2021-09-16 NOTE — Telephone Encounter (Signed)
Patients pharmacy ([facility]) called requesting the refill for Arnuity be changed to their pharmacy.   Please advise.

## 2021-09-16 NOTE — Telephone Encounter (Signed)
Called and checked with patient to make sure he was ok with medication being sent to [facility], patient gave consent. Refills for Arnuity has been sent in to [facility].

## 2021-09-17 DIAGNOSIS — G4733 Obstructive sleep apnea (adult) (pediatric): Secondary | ICD-10-CM | POA: Diagnosis not present

## 2021-10-09 DIAGNOSIS — E7800 Pure hypercholesterolemia, unspecified: Secondary | ICD-10-CM | POA: Diagnosis not present

## 2021-10-09 DIAGNOSIS — I10 Essential (primary) hypertension: Secondary | ICD-10-CM | POA: Diagnosis not present

## 2021-10-09 DIAGNOSIS — G8929 Other chronic pain: Secondary | ICD-10-CM | POA: Diagnosis not present

## 2021-10-09 DIAGNOSIS — E118 Type 2 diabetes mellitus with unspecified complications: Secondary | ICD-10-CM | POA: Diagnosis not present

## 2021-10-16 DIAGNOSIS — H0011 Chalazion right upper eyelid: Secondary | ICD-10-CM | POA: Diagnosis not present

## 2021-11-03 DIAGNOSIS — H1045 Other chronic allergic conjunctivitis: Secondary | ICD-10-CM | POA: Diagnosis not present

## 2021-11-18 DIAGNOSIS — H04123 Dry eye syndrome of bilateral lacrimal glands: Secondary | ICD-10-CM | POA: Diagnosis not present

## 2021-11-18 LAB — HM DIABETES EYE EXAM

## 2021-11-19 ENCOUNTER — Ambulatory Visit: Payer: PPO | Admitting: Allergy

## 2021-12-12 DIAGNOSIS — E7800 Pure hypercholesterolemia, unspecified: Secondary | ICD-10-CM | POA: Diagnosis not present

## 2021-12-12 DIAGNOSIS — E118 Type 2 diabetes mellitus with unspecified complications: Secondary | ICD-10-CM | POA: Diagnosis not present

## 2021-12-12 DIAGNOSIS — K219 Gastro-esophageal reflux disease without esophagitis: Secondary | ICD-10-CM | POA: Diagnosis not present

## 2021-12-12 DIAGNOSIS — I10 Essential (primary) hypertension: Secondary | ICD-10-CM | POA: Diagnosis not present

## 2021-12-12 DIAGNOSIS — G8929 Other chronic pain: Secondary | ICD-10-CM | POA: Diagnosis not present

## 2021-12-12 DIAGNOSIS — N401 Enlarged prostate with lower urinary tract symptoms: Secondary | ICD-10-CM | POA: Diagnosis not present

## 2022-01-14 DIAGNOSIS — E7800 Pure hypercholesterolemia, unspecified: Secondary | ICD-10-CM | POA: Diagnosis not present

## 2022-01-14 DIAGNOSIS — K219 Gastro-esophageal reflux disease without esophagitis: Secondary | ICD-10-CM | POA: Diagnosis not present

## 2022-01-14 DIAGNOSIS — I10 Essential (primary) hypertension: Secondary | ICD-10-CM | POA: Diagnosis not present

## 2022-01-14 DIAGNOSIS — N401 Enlarged prostate with lower urinary tract symptoms: Secondary | ICD-10-CM | POA: Diagnosis not present

## 2022-01-14 DIAGNOSIS — E118 Type 2 diabetes mellitus with unspecified complications: Secondary | ICD-10-CM | POA: Diagnosis not present

## 2022-01-14 DIAGNOSIS — G8929 Other chronic pain: Secondary | ICD-10-CM | POA: Diagnosis not present

## 2022-01-15 ENCOUNTER — Ambulatory Visit: Payer: PPO | Admitting: Allergy

## 2022-01-19 DIAGNOSIS — G629 Polyneuropathy, unspecified: Secondary | ICD-10-CM | POA: Diagnosis not present

## 2022-02-04 DIAGNOSIS — M79605 Pain in left leg: Secondary | ICD-10-CM | POA: Diagnosis not present

## 2022-02-06 DIAGNOSIS — M79605 Pain in left leg: Secondary | ICD-10-CM | POA: Diagnosis not present

## 2022-02-24 DIAGNOSIS — I10 Essential (primary) hypertension: Secondary | ICD-10-CM | POA: Diagnosis not present

## 2022-02-24 DIAGNOSIS — E118 Type 2 diabetes mellitus with unspecified complications: Secondary | ICD-10-CM | POA: Diagnosis not present

## 2022-02-24 DIAGNOSIS — G4733 Obstructive sleep apnea (adult) (pediatric): Secondary | ICD-10-CM | POA: Diagnosis not present

## 2022-02-24 DIAGNOSIS — J45909 Unspecified asthma, uncomplicated: Secondary | ICD-10-CM | POA: Diagnosis not present

## 2022-02-24 DIAGNOSIS — L03116 Cellulitis of left lower limb: Secondary | ICD-10-CM | POA: Diagnosis not present

## 2022-02-24 DIAGNOSIS — G629 Polyneuropathy, unspecified: Secondary | ICD-10-CM | POA: Diagnosis not present

## 2022-02-26 DIAGNOSIS — G629 Polyneuropathy, unspecified: Secondary | ICD-10-CM | POA: Diagnosis not present

## 2022-02-26 DIAGNOSIS — M79671 Pain in right foot: Secondary | ICD-10-CM | POA: Diagnosis not present

## 2022-03-14 ENCOUNTER — Other Ambulatory Visit: Payer: Self-pay | Admitting: Allergy

## 2022-03-23 DIAGNOSIS — E7800 Pure hypercholesterolemia, unspecified: Secondary | ICD-10-CM | POA: Diagnosis not present

## 2022-03-23 DIAGNOSIS — I10 Essential (primary) hypertension: Secondary | ICD-10-CM | POA: Diagnosis not present

## 2022-03-23 DIAGNOSIS — E118 Type 2 diabetes mellitus with unspecified complications: Secondary | ICD-10-CM | POA: Diagnosis not present

## 2022-03-23 DIAGNOSIS — G8929 Other chronic pain: Secondary | ICD-10-CM | POA: Diagnosis not present

## 2022-03-23 DIAGNOSIS — K219 Gastro-esophageal reflux disease without esophagitis: Secondary | ICD-10-CM | POA: Diagnosis not present

## 2022-03-23 DIAGNOSIS — J45909 Unspecified asthma, uncomplicated: Secondary | ICD-10-CM | POA: Diagnosis not present

## 2022-03-23 DIAGNOSIS — E039 Hypothyroidism, unspecified: Secondary | ICD-10-CM | POA: Diagnosis not present

## 2022-03-23 DIAGNOSIS — N401 Enlarged prostate with lower urinary tract symptoms: Secondary | ICD-10-CM | POA: Diagnosis not present

## 2022-03-25 ENCOUNTER — Encounter (INDEPENDENT_AMBULATORY_CARE_PROVIDER_SITE_OTHER): Payer: Self-pay

## 2022-04-14 DIAGNOSIS — G4733 Obstructive sleep apnea (adult) (pediatric): Secondary | ICD-10-CM | POA: Diagnosis not present

## 2022-04-22 ENCOUNTER — Other Ambulatory Visit: Payer: Self-pay | Admitting: Allergy

## 2022-04-28 ENCOUNTER — Encounter: Payer: Self-pay | Admitting: Neurology

## 2022-04-28 ENCOUNTER — Ambulatory Visit: Payer: PPO | Admitting: Neurology

## 2022-04-28 ENCOUNTER — Telehealth: Payer: Self-pay | Admitting: Neurology

## 2022-04-28 VITALS — BP 134/85 | HR 77 | Ht 72.0 in | Wt 288.0 lb

## 2022-04-28 DIAGNOSIS — R269 Unspecified abnormalities of gait and mobility: Secondary | ICD-10-CM | POA: Diagnosis not present

## 2022-04-28 DIAGNOSIS — R202 Paresthesia of skin: Secondary | ICD-10-CM | POA: Insufficient documentation

## 2022-04-28 MED ORDER — DICLOFENAC SODIUM 1 % EX CREA: TOPICAL_CREAM | CUTANEOUS | 11 refills | Status: DC

## 2022-04-28 MED ORDER — GABAPENTIN 600 MG PO TABS: 600.0000 mg | ORAL_TABLET | Freq: Four times a day (QID) | ORAL | 11 refills | Status: DC

## 2022-04-28 MED ORDER — GABAPENTIN 300 MG PO CAPS: 300.0000 mg | ORAL_CAPSULE | Freq: Four times a day (QID) | ORAL | 11 refills | Status: DC

## 2022-04-28 MED ORDER — LIDOCAINE-PRILOCAINE 2.5-2.5 % EX CREA: 1.0000 | TOPICAL_CREAM | CUTANEOUS | 11 refills | Status: DC | PRN

## 2022-04-28 NOTE — Telephone Encounter (Signed)
I called patient to discuss. No answer, left a message asking him to call us back. When patient calls back please route to POD 2.
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
## 2022-04-28 NOTE — Telephone Encounter (Signed)
Pt scheduled for NCV/EMG with Dr. Krista Blue on 11/22 at 10:30am. Pt would like to know if it is possible to have this test done sooner

## 2022-04-28 NOTE — Telephone Encounter (Signed)
Please let patient know the priority is to have MRI of cervical done, if MRI imaging revealed major abnormalities, it is okay to wait on EMG nerve conduction study, if needed, we will move up his nerve conduction

## 2022-04-29 ENCOUNTER — Telehealth: Payer: Self-pay | Admitting: Neurology

## 2022-04-29 NOTE — Telephone Encounter (Addendum)
Ok to prescribe pt gel instead of cream ?   Yes, Ok with Gel.  Marcial Pacas, M.D. Ph.D.  [facility] [place], [place] [address] Phone: [phone] Fax:      [phone]

## 2022-04-29 NOTE — Telephone Encounter (Signed)
James Henson is calling. Stated they can't get Diclofenac Sodium 1 % CREAm but can get the gel. James Henson is requesting a call-back.

## 2022-04-29 NOTE — Telephone Encounter (Signed)
Contacted [facility] back, gave ok to fill gel.

## 2022-04-30 ENCOUNTER — Telehealth: Payer: Self-pay

## 2022-04-30 NOTE — Telephone Encounter (Signed)
[facility] - PA Case ID: [id number] - Rx #: [id number] Status PENDING  Sent to [facility] today Drug Lidocaine-Prilocaine 2.5-2.5% cream

## 2022-05-04 ENCOUNTER — Telehealth: Payer: Self-pay | Admitting: Neurology

## 2022-05-04 NOTE — Telephone Encounter (Signed)
[facility] adv NPR sent to [facility] for open MRI

## 2022-05-07 DIAGNOSIS — I10 Essential (primary) hypertension: Secondary | ICD-10-CM | POA: Diagnosis not present

## 2022-05-07 DIAGNOSIS — G8929 Other chronic pain: Secondary | ICD-10-CM | POA: Diagnosis not present

## 2022-05-07 DIAGNOSIS — N401 Enlarged prostate with lower urinary tract symptoms: Secondary | ICD-10-CM | POA: Diagnosis not present

## 2022-05-07 DIAGNOSIS — K219 Gastro-esophageal reflux disease without esophagitis: Secondary | ICD-10-CM | POA: Diagnosis not present

## 2022-05-07 DIAGNOSIS — E118 Type 2 diabetes mellitus with unspecified complications: Secondary | ICD-10-CM | POA: Diagnosis not present

## 2022-05-07 DIAGNOSIS — E7800 Pure hypercholesterolemia, unspecified: Secondary | ICD-10-CM | POA: Diagnosis not present

## 2022-05-13 DIAGNOSIS — M50221 Other cervical disc displacement at C4-C5 level: Secondary | ICD-10-CM | POA: Diagnosis not present

## 2022-05-13 DIAGNOSIS — M47812 Spondylosis without myelopathy or radiculopathy, cervical region: Secondary | ICD-10-CM | POA: Diagnosis not present

## 2022-05-13 DIAGNOSIS — M2578 Osteophyte, vertebrae: Secondary | ICD-10-CM | POA: Diagnosis not present

## 2022-05-13 DIAGNOSIS — M4802 Spinal stenosis, cervical region: Secondary | ICD-10-CM | POA: Diagnosis not present

## 2022-05-20 ENCOUNTER — Telehealth: Payer: Self-pay | Admitting: Neurology

## 2022-05-20 DIAGNOSIS — R202 Paresthesia of skin: Secondary | ICD-10-CM

## 2022-05-20 DIAGNOSIS — N882 Stricture and stenosis of cervix uteri: Secondary | ICD-10-CM

## 2022-05-20 DIAGNOSIS — R269 Unspecified abnormalities of gait and mobility: Secondary | ICD-10-CM

## 2022-05-20 NOTE — Telephone Encounter (Signed)
Pt called stating that he is very disappointed because no one has called him with the MRI results and now he got a call stating that his NCV/EMG has to be r/s. Pt states that he wants a sooner appt and if not he will be changing providers. Pt has requested for provider or RN to call him back. Please advise.

## 2022-05-20 NOTE — Telephone Encounter (Signed)
LVM and sent mychart msg informing pt of need to reschedule NCV/EMG on 11/22 - MD out

## 2022-05-20 NOTE — Telephone Encounter (Addendum)
Called [facility], left detailed VM for medical records requesting the MRI cervical spine report (ordered by Dr Krista Blue) be faxed to [phone]. Called patient and advised him I called [facility], LVM requesting MRI report be sent. He expressed not being pleased with EMG being canceled and not rescheduled, stated he may seek another provider. I offered to reschedule; he stated he didn't want to talk about it any further. Call was ended.

## 2022-05-21 DIAGNOSIS — Z719 Counseling, unspecified: Secondary | ICD-10-CM | POA: Diagnosis not present

## 2022-05-21 DIAGNOSIS — N882 Stricture and stenosis of cervix uteri: Secondary | ICD-10-CM | POA: Insufficient documentation

## 2022-05-21 NOTE — Telephone Encounter (Signed)
Faxed referral to [facility], phone # [phone].

## 2022-05-21 NOTE — Addendum Note (Signed)
Addended by: Marcial Pacas on: 05/21/2022 09:42 AM   Modules accepted: Orders

## 2022-05-21 NOTE — Telephone Encounter (Addendum)
I called patient, MRI of cervical spine at [facility] on him and straight showed cervical spondylosis, most significant at C3-4, with facet arthropathy, disc ossified, causing severe central canal stenosis, severe bilateral foraminal narrowing  I will refer him to neurosurgeon for evaluation, he is to bring MRI CD during initial visit

## 2022-06-09 NOTE — Telephone Encounter (Signed)
Forward to medical records for processing.

## 2022-06-09 NOTE — Telephone Encounter (Signed)
Pt is calling. Stated he needs a copy of MRI in hand before he can make appointment with [facility]. Pt is requesting a call back

## 2022-06-10 DIAGNOSIS — Z23 Encounter for immunization: Secondary | ICD-10-CM | POA: Diagnosis not present

## 2022-06-10 DIAGNOSIS — G629 Polyneuropathy, unspecified: Secondary | ICD-10-CM | POA: Diagnosis not present

## 2022-06-11 ENCOUNTER — Telehealth: Payer: Self-pay | Admitting: *Deleted

## 2022-06-11 NOTE — Telephone Encounter (Signed)
Not able to reach the pt. Pt Cd @ the front desk for p/u.

## 2022-06-12 DIAGNOSIS — Z6838 Body mass index (BMI) 38.0-38.9, adult: Secondary | ICD-10-CM | POA: Diagnosis not present

## 2022-06-12 DIAGNOSIS — M4696 Unspecified inflammatory spondylopathy, lumbar region: Secondary | ICD-10-CM | POA: Diagnosis not present

## 2022-06-12 DIAGNOSIS — M47812 Spondylosis without myelopathy or radiculopathy, cervical region: Secondary | ICD-10-CM | POA: Diagnosis not present

## 2022-06-12 DIAGNOSIS — M792 Neuralgia and neuritis, unspecified: Secondary | ICD-10-CM | POA: Diagnosis not present

## 2022-06-18 DIAGNOSIS — R051 Acute cough: Secondary | ICD-10-CM | POA: Diagnosis not present

## 2022-06-18 DIAGNOSIS — J029 Acute pharyngitis, unspecified: Secondary | ICD-10-CM | POA: Diagnosis not present

## 2022-06-18 DIAGNOSIS — H60502 Unspecified acute noninfective otitis externa, left ear: Secondary | ICD-10-CM | POA: Diagnosis not present

## 2022-06-18 DIAGNOSIS — Z03818 Encounter for observation for suspected exposure to other biological agents ruled out: Secondary | ICD-10-CM | POA: Diagnosis not present

## 2022-06-18 DIAGNOSIS — H6121 Impacted cerumen, right ear: Secondary | ICD-10-CM | POA: Diagnosis not present

## 2022-06-18 DIAGNOSIS — H6123 Impacted cerumen, bilateral: Secondary | ICD-10-CM | POA: Diagnosis not present

## 2022-07-08 ENCOUNTER — Encounter: Payer: PPO | Admitting: Neurology

## 2022-07-14 DIAGNOSIS — Z03818 Encounter for observation for suspected exposure to other biological agents ruled out: Secondary | ICD-10-CM | POA: Diagnosis not present

## 2022-07-14 DIAGNOSIS — J45901 Unspecified asthma with (acute) exacerbation: Secondary | ICD-10-CM | POA: Diagnosis not present

## 2022-07-21 DIAGNOSIS — J45909 Unspecified asthma, uncomplicated: Secondary | ICD-10-CM | POA: Diagnosis not present

## 2022-07-21 DIAGNOSIS — J40 Bronchitis, not specified as acute or chronic: Secondary | ICD-10-CM | POA: Diagnosis not present

## 2022-07-22 ENCOUNTER — Other Ambulatory Visit: Payer: Self-pay | Admitting: Internal Medicine

## 2022-07-22 ENCOUNTER — Ambulatory Visit
Admission: RE | Admit: 2022-07-22 | Discharge: 2022-07-22 | Disposition: A | Discharge status: Home / Self Care | Payer: PPO | Source: Ambulatory Visit | Attending: Internal Medicine | Admitting: Internal Medicine

## 2022-07-22 DIAGNOSIS — J40 Bronchitis, not specified as acute or chronic: Secondary | ICD-10-CM

## 2022-07-29 DIAGNOSIS — E7800 Pure hypercholesterolemia, unspecified: Secondary | ICD-10-CM | POA: Diagnosis not present

## 2022-07-29 DIAGNOSIS — E039 Hypothyroidism, unspecified: Secondary | ICD-10-CM | POA: Diagnosis not present

## 2022-07-29 DIAGNOSIS — J45909 Unspecified asthma, uncomplicated: Secondary | ICD-10-CM | POA: Diagnosis not present

## 2022-07-29 DIAGNOSIS — E118 Type 2 diabetes mellitus with unspecified complications: Secondary | ICD-10-CM | POA: Diagnosis not present

## 2022-07-29 DIAGNOSIS — I10 Essential (primary) hypertension: Secondary | ICD-10-CM | POA: Diagnosis not present

## 2022-07-29 DIAGNOSIS — G8929 Other chronic pain: Secondary | ICD-10-CM | POA: Diagnosis not present

## 2022-07-29 DIAGNOSIS — K219 Gastro-esophageal reflux disease without esophagitis: Secondary | ICD-10-CM | POA: Diagnosis not present

## 2022-07-29 DIAGNOSIS — N401 Enlarged prostate with lower urinary tract symptoms: Secondary | ICD-10-CM | POA: Diagnosis not present

## 2022-08-18 ENCOUNTER — Telehealth: Payer: Self-pay

## 2022-08-18 NOTE — Telephone Encounter (Signed)
Patient's pharmacy called requesting a new prescription for Ryvent.  [facility]

## 2022-08-18 NOTE — Telephone Encounter (Signed)
Patent was last seen January of 2023 and I called to make him an appointment and he is not interested in make one at this time for medication refills.

## 2022-08-27 DIAGNOSIS — E118 Type 2 diabetes mellitus with unspecified complications: Secondary | ICD-10-CM | POA: Diagnosis not present

## 2022-08-27 DIAGNOSIS — Z125 Encounter for screening for malignant neoplasm of prostate: Secondary | ICD-10-CM | POA: Diagnosis not present

## 2022-08-27 DIAGNOSIS — Z23 Encounter for immunization: Secondary | ICD-10-CM | POA: Diagnosis not present

## 2022-08-27 DIAGNOSIS — I10 Essential (primary) hypertension: Secondary | ICD-10-CM | POA: Diagnosis not present

## 2022-08-27 DIAGNOSIS — K760 Fatty (change of) liver, not elsewhere classified: Secondary | ICD-10-CM | POA: Diagnosis not present

## 2022-08-27 DIAGNOSIS — Z0000 Encounter for general adult medical examination without abnormal findings: Secondary | ICD-10-CM | POA: Diagnosis not present

## 2022-08-27 DIAGNOSIS — G629 Polyneuropathy, unspecified: Secondary | ICD-10-CM | POA: Diagnosis not present

## 2022-08-27 DIAGNOSIS — Z1331 Encounter for screening for depression: Secondary | ICD-10-CM | POA: Diagnosis not present

## 2022-08-27 DIAGNOSIS — E039 Hypothyroidism, unspecified: Secondary | ICD-10-CM | POA: Diagnosis not present

## 2022-08-27 LAB — CBC AND DIFFERENTIAL
HCT: 45 (ref 41–53)
Hemoglobin: 14.7 (ref 13.5–17.5)
Neutrophils Absolute: 5.5
Platelets: 229 10*3/uL (ref 150–400)
WBC: 7.8

## 2022-08-27 LAB — TSH: TSH: 0.04 — AB (ref 0.41–5.90)

## 2022-08-27 LAB — HEPATIC FUNCTION PANEL
ALT: 24 U/L (ref 10–40)
AST: 19 (ref 14–40)
Alkaline Phosphatase: 89 (ref 25–125)
Bilirubin, Total: 0.7

## 2022-08-27 LAB — COMPREHENSIVE METABOLIC PANEL
Albumin: 4.1 (ref 3.5–5.0)
Calcium: 9.3 (ref 8.7–10.7)
eGFR: 71

## 2022-08-27 LAB — BASIC METABOLIC PANEL
BUN: 21 (ref 4–21)
CO2: 28 — AB (ref 13–22)
Chloride: 104 (ref 99–108)
Creatinine: 1.1 (ref 0.6–1.3)
Glucose: 114
Potassium: 4.3 mEq/L (ref 3.5–5.1)
Sodium: 139 (ref 137–147)

## 2022-08-27 LAB — HEMOGLOBIN A1C: Hemoglobin A1C: 6.9

## 2022-08-27 LAB — PSA: PSA: 3.46

## 2022-08-27 LAB — CBC: RBC: 5.21 — AB (ref 3.87–5.11)

## 2022-08-31 DIAGNOSIS — G4733 Obstructive sleep apnea (adult) (pediatric): Secondary | ICD-10-CM | POA: Diagnosis not present

## 2022-11-25 ENCOUNTER — Ambulatory Visit
Admission: RE | Admit: 2022-11-25 | Discharge: 2022-11-25 | Disposition: A | Discharge status: Home / Self Care | Payer: PPO | Source: Ambulatory Visit | Attending: Physician Assistant | Admitting: Physician Assistant

## 2022-11-25 ENCOUNTER — Other Ambulatory Visit: Payer: Self-pay | Admitting: Physician Assistant

## 2022-11-25 DIAGNOSIS — J4541 Moderate persistent asthma with (acute) exacerbation: Secondary | ICD-10-CM

## 2022-11-26 ENCOUNTER — Other Ambulatory Visit: Payer: Self-pay

## 2022-11-26 ENCOUNTER — Encounter: Payer: Self-pay | Admitting: Allergy

## 2022-11-26 ENCOUNTER — Ambulatory Visit (INDEPENDENT_AMBULATORY_CARE_PROVIDER_SITE_OTHER): Payer: PPO | Admitting: Allergy

## 2022-11-26 VITALS — BP 130/100 | HR 76 | Temp 98.2°F | Wt 289.2 lb

## 2022-11-26 DIAGNOSIS — J4541 Moderate persistent asthma with (acute) exacerbation: Secondary | ICD-10-CM | POA: Diagnosis not present

## 2022-11-26 DIAGNOSIS — K219 Gastro-esophageal reflux disease without esophagitis: Secondary | ICD-10-CM | POA: Diagnosis not present

## 2022-11-26 DIAGNOSIS — J310 Chronic rhinitis: Secondary | ICD-10-CM

## 2022-11-26 DIAGNOSIS — R0982 Postnasal drip: Secondary | ICD-10-CM | POA: Diagnosis not present

## 2022-11-26 MED ORDER — ALBUTEROL SULFATE HFA 108 (90 BASE) MCG/ACT IN AERS: 2.0000 | INHALATION_SPRAY | Freq: Four times a day (QID) | RESPIRATORY_TRACT | 1 refills | Status: DC | PRN

## 2022-11-26 MED ORDER — QNASL 80 MCG/ACT NA AERS: INHALATION_SPRAY | NASAL | 5 refills | Status: DC

## 2022-11-26 MED ORDER — MONTELUKAST SODIUM 10 MG PO TABS: 10.0000 mg | ORAL_TABLET | Freq: Every day | ORAL | 5 refills | Status: DC

## 2022-11-26 MED ORDER — CARBINOXAMINE MALEATE 6 MG PO TABS: 1.0000 | ORAL_TABLET | Freq: Two times a day (BID) | ORAL | 1 refills | Status: DC

## 2022-11-26 MED ORDER — BREO ELLIPTA 200-25 MCG/ACT IN AEPB: 1.0000 | INHALATION_SPRAY | Freq: Every day | RESPIRATORY_TRACT | 5 refills | Status: DC

## 2022-11-26 MED ORDER — ALBUTEROL SULFATE (2.5 MG/3ML) 0.083% IN NEBU: 2.5000 mg | INHALATION_SOLUTION | RESPIRATORY_TRACT | 5 refills | Status: DC | PRN

## 2022-11-26 MED ORDER — ESOMEPRAZOLE MAGNESIUM 40 MG PO CPDR: 40.0000 mg | DELAYED_RELEASE_CAPSULE | Freq: Every day | ORAL | 5 refills | Status: DC

## 2022-11-26 MED ORDER — FAMOTIDINE 10 MG PO TABS: 10.0000 mg | ORAL_TABLET | Freq: Two times a day (BID) | ORAL | 5 refills | Status: DC

## 2022-11-26 NOTE — Progress Notes (Signed)
Follow-up Note  RE: James Dolson Schultes Sr. MRN: [id number] DOB: 1951-03-23 Date of Office Visit: 11/26/2022   History of present illness: James Lax Pagnotta Sr. is a 72 y.o. male presenting today for follow-up.  He is currently sick.  He has history of asthma, rhinitis and reflux.  He was last seen in the office on 08/21/2021 by myself.  He states for 11 days now he has had productive cough, fatigue and Merrilee. He did see his PCP yesterday for symptoms and was negative for Covid, RSV, flu, strep.  He was treated with neb treatment with duoneb as well as depomedrol injection. He had a CXR done yesterday that was negative for pneumonia.  He was recommended to use prednisone which he started about a week ago which he has completed.  Azithromycin he also took alongside the prednisone.  He states with this course the mucus is less colored and less thick.  However he still has quite a productive cough.  In regards to his asthma he does continue to use Arnuity 1 puff once a day when he does not have an albuterol inhaler.  He also does not have a nebulizer  With his rhinitis he states he has been having runny nose.  He does have azelastine that he states he does use currently and finds it effective at this time.  He also has Qnasl that he uses for congestion.  He continues also on montelukast once a day.  He is taking RyVent 1 tablet once a day at this time but states he likely will increase it to twice a day.  From a reflux standpoint he has been in his omeprazole for this control.  Review of systems: Review of Systems  Constitutional:  Positive for fatigue.  HENT:  Positive for rhinorrhea.   Eyes: Negative.   Respiratory:  Positive for cough and shortness of breath.   Cardiovascular: Negative.   Musculoskeletal: Negative.   Skin: Negative.   Allergic/Immunologic: Negative.   Neurological: Negative.      All other systems negative unless noted above in HPI  Past  medical/social/surgical/family history have been reviewed and are unchanged unless specifically indicated below.  No changes  Medication List: Current Outpatient Medications  Medication Sig Dispense Refill   albuterol (PROVENTIL) (2.5 MG/3ML) 0.083% nebulizer solution Take 3 mLs (2.5 mg total) by nebulization every 4 (four) hours as needed for wheezing or shortness of breath (cough or chest tightness). 75 mL 5   albuterol (VENTOLIN HFA) 108 (90 Base) MCG/ACT inhaler Inhale 2 puffs into the lungs every 6 (six) hours as needed for wheezing or shortness of breath. 18 g 1   atorvastatin (LIPITOR) 40 MG tablet TK 1 T PO  D  3   BREO ELLIPTA 200-25 MCG/ACT AEPB Inhale 1 puff into the lungs daily. 60 each 5   Diclofenac Sodium 1 % CREA 1 gram qid prn for 120 g 11   gabapentin (NEURONTIN) 300 MG capsule Take 1 capsule (300 mg total) by mouth 4 (four) times daily. 120 capsule 11   gabapentin (NEURONTIN) 600 MG tablet Take 1 tablet (600 mg total) by mouth 4 (four) times daily. 120 tablet 11   levothyroxine (SYNTHROID, LEVOTHROID) 200 MCG tablet Take 1 tablet by mouth daily. To be taken with a 25 mcg tablet to = 225 mcg qd  9   levothyroxine (SYNTHROID, LEVOTHROID) 50 MCG tablet TK 1 T PO  QAM ON AN EMPTY STOMACH  3   lidocaine-prilocaine (EMLA) cream Apply  1 Application topically as needed. 1 gram qid prn 30 g 11   losartan (COZAAR) 100 MG tablet Take 100 mg by mouth daily.     metFORMIN (GLUCOPHAGE) 500 MG tablet TAKE 1 TABLET(500 MG) BY MOUTH DAILY WITH BREAKFAST 30 tablet 0   Vitamin D, Ergocalciferol, (DRISDOL) 1.25 MG (50000 UT) CAPS capsule Take 1 capsule (50,000 Units total) by mouth every 7 (seven) days. 4 capsule 0   Beclomethasone Dipropionate (QNASL) 80 MCG/ACT AERS USE 1 SPRAY IN THE NOSE TWICE DAILY 8.7 g 5   Carbinoxamine Maleate (RYVENT) 6 MG TABS Take 1 tablet (6 mg total) by mouth 2 (two) times daily. 120 tablet 1   esomeprazole (NEXIUM) 40 MG capsule Take 1 capsule (40 mg total) by  mouth daily at 12 noon. 30 capsule 5   famotidine (PEPCID) 10 MG tablet Take 1 tablet (10 mg total) by mouth 2 (two) times daily. 60 tablet 5   ipratropium (ATROVENT) 0.06 % nasal spray Place 2 sprays into both nostrils 2 (two) times daily. (Patient not taking: Reported on 11/26/2022) 15 mL 5   montelukast (SINGULAIR) 10 MG tablet Take 1 tablet (10 mg total) by mouth at bedtime. 30 tablet 5   No current facility-administered medications for this visit.     Known medication allergies: Allergies  Allergen Reactions   No Known Allergies      Physical examination: Blood pressure (!) 130/100, pulse 76, temperature 98.2 F (36.8 C), temperature source Temporal, weight 289 lb 3.2 oz (131.2 kg), SpO2 91 %.  General: Alert, interactive, in no acute distress. Neck: Supple without lymphadenopathy. Lungs: Mildly decreased breath sounds with rhonchi throughout . {increased work of breathing. CV: Normal S1, S2 without murmurs. Abdomen: Nondistended, nontender. Skin: Warm and dry, without lesions or rashes. Extremities:  No clubbing, cyanosis or edema. Neuro:   Grossly intact.  Diagnositics/Labs: Duoneb given in office due to lung exam followed by spirometry  Spirometry: FEV1: 2.02L 61%, FVC: 2.65L 60% predicted. This is however an improved study from last year despite illness.  There is a restrictive ptatern   Assessment and plan: Asthma bronchitis with current illness - you have completed prednisone and azithromycin course with some improvement of symptoms - you received steroid injection yesterday which should start to work to help with symptoms - will step up you asthma inhaler as below - get adequate rest and stay well hydrated - can use over-the-counter Mucinex DM which can help with cough and thin mucus  - breathing treatment given by nebulizer.  Will arrange for home nebulizer.  - stop Arnuity and step-up therapy to Breo 200mcg 1 puff daily.   - have access to albuterol inhaler 2  puffs OR albuterol 1 vial via nebulier every 4 hours as needed for cough/wheeze/shortness of breath/chest tightness.  May use 15-20 minutes prior to activity.   Monitor frequency of use.    Control goals:  Full participation in all desired activities (may need albuterol before activity) Albuterol use two time or less a week on average (not counting use with activity) Cough interfering with sleep two time or less a month Oral steroids no more than once a year No hospitalizations   Rhinitis - can continue Azelastine 2 sprays each nostril twice a day for runny nose control.  Let us know if this becomes ineffective.  - can continue Qnasl 1 spray twice a day for nasal congestion control.  Use for 1-2 weeks at a time before stopping once congestion improves.  - continue  Ryvent (carbinaxomine) 4-6mg  1 tab twice a day at this time.  - continue Montelukast 10mg  daily. - if symptoms not improving with above regimen then would recommend skin testing to environmental allergens.  Will need to hold antihistamine for at least 3 days prior to any skin testing visit  Reflux - continue Famotidine and Esomeprazole for reflux control  Return in about 3-4 months.  I appreciate the opportunity to take part in Navon's care. Please do not hesitate to contact me with questions.  Sincerely,   Margo Aye, MD Allergy/Immunology [facility]

## 2022-11-26 NOTE — Patient Instructions (Signed)
Asthma bronchitis with current illness - you have completed prednisone and azithromycin course with some improvement of symptoms - you received steroid injection yesterday which should start to work to help with symptoms - will step up you asthma inhaler as below - get adequate rest and stay well hydrated - can use over-the-counter Mucinex DM which can help with cough and thin mucus  Rhinitis - can continue Azelastine 2 sprays each nostril twice a day for runny nose control.  Let us know if this becomes ineffective.  - can continue Qnasl 1 spray twice a day for nasal congestion control.  Use for 1-2 weeks at a time before stopping once congestion improves.  - continue Ryvent (carbinaxomine) 4-6mg  1 tab twice a day at this time.  - continue Montelukast 10mg  daily. - if symptoms not improving with above regimen then would recommend skin testing to environmental allergens.  Will need to hold antihistamine for at least 3 days prior to any skin testing visit  Reflux - continue Famotidine and Esomeprazole for reflux control  Asthma - breathing treatment given by nebulizer.  Will arrange for home nebulizer.  - stop Arnuity and step-up therapy to Breo 1 puff daily.   - have access to albuterol inhaler 2 puffs OR albuterol 1 vial via nebulier every 4 hours as needed for cough/wheeze/shortness of breath/chest tightness.  May use 15-20 minutes prior to activity.   Monitor frequency of use.    Control goals:  Full participation in all desired activities (may need albuterol before activity) Albuterol use two time or less a week on average (not counting use with activity) Cough interfering with sleep two time or less a month Oral steroids no more than once a year No hospitalizations  Return in about 3-4 months.

## 2022-12-11 ENCOUNTER — Telehealth: Payer: Self-pay | Admitting: Internal Medicine

## 2022-12-11 NOTE — Telephone Encounter (Signed)
Please advise 

## 2022-12-11 NOTE — Telephone Encounter (Signed)
Pt called stating that he would like to establish care with Dr. Drue Novel. Pt stated that he had been highly recommended by Verdon Cummins who is a current pt. Please Advise.

## 2022-12-13 NOTE — Telephone Encounter (Signed)
Is ok, please get records from previous PCP

## 2022-12-14 NOTE — Telephone Encounter (Signed)
Okay to schedule NP appt, please have him come by to sign ROI to get records from last PCP please.

## 2022-12-14 NOTE — Telephone Encounter (Signed)
Pt called to follow up on NP decision. Pt was advised of acceptance and scheduled for NP appt.

## 2022-12-23 ENCOUNTER — Encounter: Payer: Self-pay | Admitting: Internal Medicine

## 2022-12-23 DIAGNOSIS — K760 Fatty (change of) liver, not elsewhere classified: Secondary | ICD-10-CM | POA: Insufficient documentation

## 2022-12-23 DIAGNOSIS — H3530 Unspecified macular degeneration: Secondary | ICD-10-CM | POA: Insufficient documentation

## 2022-12-23 DIAGNOSIS — N401 Enlarged prostate with lower urinary tract symptoms: Secondary | ICD-10-CM | POA: Insufficient documentation

## 2022-12-23 DIAGNOSIS — G4733 Obstructive sleep apnea (adult) (pediatric): Secondary | ICD-10-CM | POA: Insufficient documentation

## 2022-12-23 DIAGNOSIS — M47812 Spondylosis without myelopathy or radiculopathy, cervical region: Secondary | ICD-10-CM | POA: Insufficient documentation

## 2023-01-01 ENCOUNTER — Other Ambulatory Visit: Payer: PPO | Admitting: Pharmacist

## 2023-01-01 ENCOUNTER — Ambulatory Visit (INDEPENDENT_AMBULATORY_CARE_PROVIDER_SITE_OTHER): Payer: PPO | Admitting: Internal Medicine

## 2023-01-01 ENCOUNTER — Encounter: Payer: Self-pay | Admitting: Internal Medicine

## 2023-01-01 VITALS — BP 132/70 | HR 67 | Temp 98.4°F | Resp 16 | Ht 72.0 in | Wt 296.4 lb

## 2023-01-01 DIAGNOSIS — I10 Essential (primary) hypertension: Secondary | ICD-10-CM

## 2023-01-01 DIAGNOSIS — E079 Disorder of thyroid, unspecified: Secondary | ICD-10-CM | POA: Diagnosis not present

## 2023-01-01 DIAGNOSIS — G629 Polyneuropathy, unspecified: Secondary | ICD-10-CM

## 2023-01-01 DIAGNOSIS — Z7984 Long term (current) use of oral hypoglycemic drugs: Secondary | ICD-10-CM

## 2023-01-01 DIAGNOSIS — E119 Type 2 diabetes mellitus without complications: Secondary | ICD-10-CM | POA: Diagnosis not present

## 2023-01-01 DIAGNOSIS — E559 Vitamin D deficiency, unspecified: Secondary | ICD-10-CM

## 2023-01-01 LAB — CBC WITH DIFFERENTIAL/PLATELET
HCT: 45.5 % (ref 38.5–50.0)
Lymphs Abs: 1621 cells/uL (ref 850–3900)
Platelets: 245 10*3/uL (ref 140–400)

## 2023-01-01 MED ORDER — FREESTYLE LIBRE 3 READER DEVI: 1.0000 | Freq: Every day | 0 refills | Status: AC

## 2023-01-01 MED ORDER — FREESTYLE LIBRE 3 SENSOR MISC: 2 refills | Status: AC

## 2023-01-01 NOTE — Progress Notes (Signed)
01/01/2023 Name: James Stutzman Fullard Sr. MRN: [id number] DOB: 1951/02/06  Chief Complaint  Patient presents with   Diabetes    James Clemence Delprado Sr. is a 72 y.o. year old male who was referred for medication management by their primary care provider, Wanda Plump, MD. They presented for a face to face visit today.   They were referred to the pharmacist by their PCP for assistance in managing  diabetes and to start Continuous Glucose Monitor.       Subjective:  Patient has used Continuous Glucose Monitor Libre sensors in past but he states that something broke and he stopped using about 6 months ago. He was not sure but thinks maybe the [place] reader broke.  He is interested in trying to start using again.    Objective:  Lab Results  Component Value Date   HGBA1C 5.9 (H) 03/13/2019    Lab Results  Component Value Date   CREATININE 1.17 03/13/2019   BUN 23 03/13/2019   NA 139 03/13/2019   K 4.6 03/13/2019   CL 101 03/13/2019   CO2 25 03/13/2019    Lab Results  Component Value Date   CHOL 118 03/13/2019   HDL 35 (L) 03/13/2019   LDLCALC 59 03/13/2019   TRIG 120 03/13/2019    Medications Reviewed Today     Reviewed by Conrad [place], CMA (Certified Medical Assistant) on 01/01/23 at 1335  Med List Status: <None>   Medication Order Taking? Sig Documenting Provider Last Dose Status Informant  albuterol (PROVENTIL) (2.5 MG/3ML) 0.083% nebulizer solution [id number]  Take 3 mLs (2.5 mg total) by nebulization every 4 (four) hours as needed for wheezing or shortness of breath (cough or chest tightness). Marcelyn Bruins, MD  Active   albuterol (VENTOLIN HFA) 108 (90 Base) MCG/ACT inhaler [id number]  Inhale 2 puffs into the lungs every 6 (six) hours as needed for wheezing or shortness of breath. Marcelyn Bruins, MD  Active   atorvastatin (LIPITOR) 40 MG tablet [id number]  TK 1 T PO  D [provider]  Active   Beclomethasone Dipropionate  (QNASL) 80 MCG/ACT AERS [id number]  USE 1 SPRAY IN THE NOSE TWICE DAILY Marcelyn Bruins, MD  Active   BREO ELLIPTA 200-25 MCG/ACT AEPB [id number]  Inhale 1 puff into the lungs daily. Marcelyn Bruins, MD  Active   Carbinoxamine Maleate (RYVENT) 6 MG TABS [id number]  Take 1 tablet (6 mg total) by mouth 2 (two) times daily. Marcelyn Bruins, MD  Active   Diclofenac Sodium 1 % CREA [id number]  1 gram qid prn for Levert Feinstein, MD  Active   esomeprazole (NEXIUM) 40 MG capsule [id number]  Take 1 capsule (40 mg total) by mouth daily at 12 noon. Marcelyn Bruins, MD  Active   famotidine (PEPCID) 10 MG tablet [id number]  Take 1 tablet (10 mg total) by mouth 2 (two) times daily. Marcelyn Bruins, MD  Active   gabapentin (NEURONTIN) 300 MG capsule [id number]  Take 1 capsule (300 mg total) by mouth 4 (four) times daily. Levert Feinstein, MD  Active   gabapentin (NEURONTIN) 600 MG tablet [id number]  Take 1 tablet (600 mg total) by mouth 4 (four) times daily. Levert Feinstein, MD  Active   ipratropium (ATROVENT) 0.06 % nasal spray [id number]  Place 2 sprays into both nostrils 2 (two) times daily.  Patient not taking: Reported on 11/26/2022   Marcelyn Bruins, MD  Active   levothyroxine Erline Levine, LEVOTHROID) 200 MCG tablet [id number]  Take 1 tablet by mouth daily. To be taken with a 25 mcg tablet to = 225 mcg qd [provider]  Active Self           Med Note Floyce Stakes, [place] A   Wed Apr 08, 2016  1:23 PM)    levothyroxine (SYNTHROID, LEVOTHROID) 50 MCG tablet [id number]  TK 1 T PO  QAM ON AN EMPTY STOMACH [provider]  Active   lidocaine-prilocaine (EMLA) cream [id number]  Apply 1 Application topically as needed. 1 gram qid prn Levert Feinstein, MD  Active   losartan (COZAAR) 100 MG tablet [id number]  Take 100 mg by mouth daily. [provider]  Active   metFORMIN (GLUCOPHAGE) 500 MG tablet [id number]  TAKE 1 TABLET(500 MG) BY MOUTH DAILY WITH BREAKFAST Manson Passey,  Angel A, DO  Active   montelukast (SINGULAIR) 10 MG tablet [id number]  Take 1 tablet (10 mg total) by mouth at bedtime. Marcelyn Bruins, MD  Active   Vitamin D, Ergocalciferol, (DRISDOL) 1.25 MG (50000 UT) CAPS capsule [id number]  Take 1 capsule (50,000 Units total) by mouth every 7 (seven) days. [place] Nickel, DO  Active               Assessment/Plan:   Diabetes: Patient received the following instruction for Freestyle Libre 3 Personal CGM:   - preparation of placement site - clean with alcohol and allow to dry.  Sensor is to only be place on back of upper arm.  Patient to rotate sides and site.   -care of sensor and site   - reminded that sensor is waterproof up to 3 feet and for 30 minutes.   - Assisted in downloading [place] 3 app to her phone and with app / account set up.  However we were unable to get his phone to scan sensor after multiple tries and restart of his phone.  Ordered Libre 3 reader to use to scan sensor.   - reviewed how to read and respond to trend arrows.   - reminded that when magnifying glass symbols shows up she is to confirm BG with finger stick before making any treatment decisions. .   - pt signed up for libre view in office. Linked with PCP office.   Follow Up Plan: next week to see if any questions about using Continuous Glucose Monitor.   Henrene Pastor, PharmD Clinical Pharmacist [facility]
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
## 2023-01-01 NOTE — Patient Instructions (Addendum)
Vaccines I recommend: Tdap (tetanus) Covid booster Shingrix (shingles) RSV vaccine   Check the  blood pressure regularly BP GOAL is between 110/65 and  135/85. If it is consistently higher or lower, let me know    GO TO THE LAB : Get the blood work     GO TO THE FRONT DESK, PLEASE SCHEDULE YOUR APPOINTMENTS Come back for checkup in 3 months   Per our records you are due for your diabetic eye exam. Please contact your eye doctor to schedule an appointment. Please have them send copies of your office visit notes to [place]. Our fax number is [phone]. If you need a referral to an eye doctor please let us know.

## 2023-01-02 ENCOUNTER — Encounter: Payer: Self-pay | Admitting: Internal Medicine

## 2023-01-02 DIAGNOSIS — Z09 Encounter for follow-up examination after completed treatment for conditions other than malignant neoplasm: Secondary | ICD-10-CM | POA: Insufficient documentation

## 2023-01-02 LAB — CBC WITH DIFFERENTIAL/PLATELET
Absolute Monocytes: 504 cells/uL (ref 200–950)
Basophils Absolute: 73 cells/uL (ref 0–200)
Basophils Relative: 1 %
Eosinophils Absolute: 139 cells/uL (ref 15–500)
Eosinophils Relative: 1.9 %
Hemoglobin: 15.1 g/dL (ref 13.2–17.1)
MCH: 29.3 pg (ref 27.0–33.0)
MCHC: 33.2 g/dL (ref 32.0–36.0)
MCV: 88.3 fL (ref 80.0–100.0)
MPV: 10.1 fL (ref 7.5–12.5)
Monocytes Relative: 6.9 %
Neutro Abs: 4964 cells/uL (ref 1500–7800)
Neutrophils Relative %: 68 %
RBC: 5.15 10*6/uL (ref 4.20–5.80)
RDW: 14 % (ref 11.0–15.0)
Total Lymphocyte: 22.2 %
WBC: 7.3 10*3/uL (ref 3.8–10.8)

## 2023-01-02 LAB — COMPREHENSIVE METABOLIC PANEL
AG Ratio: 1.8 (calc) (ref 1.0–2.5)
ALT: 28 U/L (ref 9–46)
AST: 22 U/L (ref 10–35)
Albumin: 4 g/dL (ref 3.6–5.1)
Alkaline phosphatase (APISO): 67 U/L (ref 35–144)
BUN: 19 mg/dL (ref 7–25)
CO2: 24 mmol/L (ref 20–32)
Calcium: 8.9 mg/dL (ref 8.6–10.3)
Chloride: 106 mmol/L (ref 98–110)
Creat: 1.03 mg/dL (ref 0.70–1.28)
Globulin: 2.2 g/dL (calc) (ref 1.9–3.7)
Glucose, Bld: 114 mg/dL — ABNORMAL HIGH (ref 65–99)
Potassium: 4.3 mmol/L (ref 3.5–5.3)
Sodium: 141 mmol/L (ref 135–146)
Total Bilirubin: 0.5 mg/dL (ref 0.2–1.2)
Total Protein: 6.2 g/dL (ref 6.1–8.1)

## 2023-01-02 LAB — MICROALBUMIN / CREATININE URINE RATIO
Creatinine, Urine: 116 mg/dL (ref 20–320)
Microalb Creat Ratio: 9 mg/g creat (ref <30)
Microalb, Ur: 1 mg/dL

## 2023-01-02 LAB — TSH: TSH: 0.03 mIU/L — ABNORMAL LOW (ref 0.40–4.50)

## 2023-01-02 LAB — HEMOGLOBIN A1C
Hgb A1c MFr Bld: 7.7 % of total Hgb — ABNORMAL HIGH (ref <5.7)
Mean Plasma Glucose: 174 mg/dL
eAG (mmol/L): 9.7 mmol/L

## 2023-01-02 LAB — VITAMIN D 25 HYDROXY (VIT D DEFICIENCY, FRACTURES): Vit D, 25-Hydroxy: 43 ng/mL (ref 30–100)

## 2023-01-02 NOTE — Assessment & Plan Note (Signed)
New patient, previous PCP Dr. Valentina Lucks retired. DM: Currently on metformin.  No recent CBGs.  In the past he used CGM and would like to start again.  Will refer him to our pharmacist. Check A1c. DM neuropathy: On chart review, he has been taking both gabapentin and Lyrica.  Advised patient that typically they are not rx together, his previous PCP apparently told him the same however only taking both of them he has relatively well-controlled neuropathy.  He seems to be tolerating well thus will RF meds prn and obtain previous records HTN: BP today is very good, continue losartan, check CMP and CBC High cholesterol, on atorvastatin.  Total cholesterol was 146 last year per [facility]. OSA: Good CPAP compliance Asthma: Well controlled until he started Breo. Vitamin D deficiency: Not supplements.  Labs. New patient, get records from previous PCP RTC 3 months

## 2023-01-04 ENCOUNTER — Telehealth: Payer: Self-pay

## 2023-01-04 ENCOUNTER — Encounter: Payer: Self-pay | Admitting: Internal Medicine

## 2023-01-04 MED ORDER — LEVOTHYROXINE SODIUM 175 MCG PO TABS: 175.0000 ug | ORAL_TABLET | Freq: Every day | ORAL | 0 refills | Status: DC

## 2023-01-04 MED ORDER — RYBELSUS 3 MG PO TABS: 3.0000 mg | ORAL_TABLET | Freq: Every day | ORAL | 0 refills | Status: DC

## 2023-01-04 MED ORDER — METFORMIN HCL 1000 MG PO TABS: 1000.0000 mg | ORAL_TABLET | Freq: Two times a day (BID) | ORAL | 1 refills | Status: DC

## 2023-01-04 NOTE — Addendum Note (Signed)
Addended byConrad Springbrook D on: 01/04/2023 09:42 AM   Modules accepted: Orders

## 2023-01-04 NOTE — Telephone Encounter (Signed)
Received medical records from [place]. Placed in PCP yellow folder to be reviewed.

## 2023-01-05 NOTE — Telephone Encounter (Signed)
Course reviewed. - History of fatty liver.  Anti-smooth muscle antibody negative.  Hemochromatosis screen negative. - History of prostate biopsy 2001: Negative

## 2023-01-06 ENCOUNTER — Encounter: Payer: Self-pay | Admitting: Internal Medicine

## 2023-01-06 NOTE — Telephone Encounter (Signed)
Records sent for scanning

## 2023-01-08 ENCOUNTER — Telehealth: Payer: Self-pay | Admitting: Pharmacist

## 2023-01-08 ENCOUNTER — Telehealth: Payer: Self-pay

## 2023-01-08 NOTE — Telephone Encounter (Signed)
PA approved.   24-MAY-24:24-MAY-25 Rybelsus 3MG  OR TABS Quantity:30;

## 2023-01-08 NOTE — Telephone Encounter (Signed)
SPoke with patient to see if he was able to start Continuous Glucose Monitor Libre sensor. We tried to use app on phone was we were unable to get sensor and phone to connect. Rx for [facility] reader was sent to [facility] last week and was ordered and filled 01/04/2023 - patient has not picked up yet. He will let me know if any issues.

## 2023-01-08 NOTE — Telephone Encounter (Signed)
PA initiated via Covermymeds; KEY: [id number]. Awaiting determination.

## 2023-01-14 NOTE — Telephone Encounter (Signed)
Followed up with patient regarding Continuous Glucose Monitor sensors and monitor. Patient has received reader but states he does not feel like it is working as it should. He reports that there is a lag in when the reader takes a reading. Offer to have him come to office and I could review it but he declined. States he will discuss with pharmacist at [facility]  He asked about Rx for Rybelsus. He has not received yet.  Called [facility] and they has profiled. Asked [facility] to reprocess as prior authorization was approved. [facility] was able to fill today (will fill for #14 so that they can sync with other medications)

## 2023-01-18 ENCOUNTER — Telehealth: Payer: Self-pay | Admitting: Internal Medicine

## 2023-01-18 MED ORDER — RYBELSUS 3 MG PO TABS: 3.0000 mg | ORAL_TABLET | Freq: Every day | ORAL | 0 refills | Status: DC

## 2023-01-18 NOTE — Telephone Encounter (Signed)
Rx sent in

## 2023-01-18 NOTE — Telephone Encounter (Signed)
Pharmacy states pt only received 8 pills because they are trying to sync his medications so they get refilled at the same time. They are requesting a 30 day supply of RYBELSUS so it will be ready by 6/5.    [facility] - [place], [place] - [address], [place] [address] Phone: [phone]  Fax: [phone]

## 2023-01-19 ENCOUNTER — Other Ambulatory Visit: Payer: Self-pay | Admitting: Internal Medicine

## 2023-01-19 DIAGNOSIS — J3089 Other allergic rhinitis: Secondary | ICD-10-CM

## 2023-01-19 DIAGNOSIS — K219 Gastro-esophageal reflux disease without esophagitis: Secondary | ICD-10-CM

## 2023-01-20 ENCOUNTER — Telehealth: Payer: Self-pay

## 2023-01-20 MED ORDER — PREGABALIN 150 MG PO CAPS: 150.0000 mg | ORAL_CAPSULE | Freq: Two times a day (BID) | ORAL | 0 refills | Status: DC

## 2023-01-20 NOTE — Telephone Encounter (Signed)
See LOV, he takes both Lyrica and gabapentin, evidently tolerating well, stated that that is the only way he can get his neuropathy symptoms relatively controlled.  Will RF Lyrica.

## 2023-01-20 NOTE — Telephone Encounter (Signed)
Requesting: Lyrica 150mg  Contract: None yet UDS: None yet Last Visit: 01/01/23 Next Visit:  04/07/23 Last Refill: New Pt as of 01/01/23  Will get contract and UDS at next OV   Please Advise

## 2023-01-22 ENCOUNTER — Other Ambulatory Visit: Payer: Self-pay | Admitting: *Deleted

## 2023-01-22 ENCOUNTER — Telehealth: Payer: Self-pay | Admitting: Allergy

## 2023-01-22 MED ORDER — BREO ELLIPTA 200-25 MCG/ACT IN AEPB: 1.0000 | INHALATION_SPRAY | Freq: Every day | RESPIRATORY_TRACT | 5 refills | Status: DC

## 2023-01-22 NOTE — Telephone Encounter (Signed)
Prescription has been sent in to requested pharmacy.  

## 2023-01-22 NOTE — Telephone Encounter (Signed)
[facility] request a prescription for Breo. [phone]

## 2023-01-27 ENCOUNTER — Telehealth: Payer: Self-pay | Admitting: Internal Medicine

## 2023-01-27 MED ORDER — METFORMIN HCL 1000 MG PO TABS: 1000.0000 mg | ORAL_TABLET | Freq: Two times a day (BID) | ORAL | 1 refills | Status: DC

## 2023-01-27 NOTE — Addendum Note (Signed)
Addended byConrad [place] D on: 01/27/2023 12:30 PM   Modules accepted: Orders

## 2023-01-27 NOTE — Telephone Encounter (Signed)
[facility] called to advise that patient said his metformin increased to 1000 mg instead of 500 mg but they do not have that prescription on file for the higher dose. Advised that new script was sent in on 01/04/23 but they have not received it. Please resend updated metformin rx to [facility]

## 2023-01-27 NOTE — Telephone Encounter (Signed)
Rx resent.

## 2023-02-11 ENCOUNTER — Other Ambulatory Visit: Payer: Self-pay | Admitting: Internal Medicine

## 2023-02-11 MED ORDER — RYBELSUS 7 MG PO TABS: 7.0000 mg | ORAL_TABLET | Freq: Every day | ORAL | 0 refills | Status: DC

## 2023-02-11 NOTE — Telephone Encounter (Signed)
Ok, 1 month supply

## 2023-02-11 NOTE — Telephone Encounter (Signed)
Rx sent 

## 2023-02-11 NOTE — Telephone Encounter (Signed)
Pt has completed the 1 month of Rybelsus 3mg - okay to send in 7mg ?

## 2023-02-15 ENCOUNTER — Telehealth: Payer: Self-pay

## 2023-02-15 ENCOUNTER — Other Ambulatory Visit: Payer: Self-pay | Admitting: Internal Medicine

## 2023-02-15 DIAGNOSIS — N401 Enlarged prostate with lower urinary tract symptoms: Secondary | ICD-10-CM

## 2023-02-15 NOTE — Telephone Encounter (Signed)
Spoke w/ [facility]- they wanted to confirm dosing on metformin. Chart reviewed, Pt should be on metformin 1000mg  bid. They also wanted to see if Pt should be on trulicity and Tolterodine. Informed that we our notes he is on neither.

## 2023-03-02 ENCOUNTER — Telehealth: Payer: Self-pay | Admitting: Internal Medicine

## 2023-03-02 ENCOUNTER — Ambulatory Visit: Payer: PPO

## 2023-03-02 NOTE — Telephone Encounter (Signed)
Patient called is needing a med refill on Tolterodine 90 qty. [facility] send to [facility].

## 2023-03-02 NOTE — Telephone Encounter (Signed)
Please advise? This medicine is not on his med list.

## 2023-03-03 MED ORDER — TOLTERODINE TARTRATE ER 4 MG PO CP24: 4.0000 mg | ORAL_CAPSULE | Freq: Every day | ORAL | 0 refills | Status: DC

## 2023-03-03 NOTE — Telephone Encounter (Signed)
I reviewed outside pharmacy medications.  He has been taking this medication before. Send a prescription for tolterodine ER 4 mg 1 capsule daily 90-day supply no refills

## 2023-03-03 NOTE — Telephone Encounter (Signed)
 Rx sent 

## 2023-03-03 NOTE — Addendum Note (Signed)
Addended byConrad Stickney D on: 03/03/2023 12:52 PM   Modules accepted: Orders

## 2023-03-17 ENCOUNTER — Other Ambulatory Visit: Payer: Self-pay | Admitting: Internal Medicine

## 2023-03-17 ENCOUNTER — Encounter (INDEPENDENT_AMBULATORY_CARE_PROVIDER_SITE_OTHER): Payer: Self-pay

## 2023-03-31 ENCOUNTER — Ambulatory Visit: Payer: PPO | Admitting: Allergy

## 2023-03-31 ENCOUNTER — Encounter: Payer: Self-pay | Admitting: Allergy

## 2023-03-31 VITALS — BP 134/76 | HR 78 | Temp 98.4°F | Resp 16

## 2023-03-31 DIAGNOSIS — K219 Gastro-esophageal reflux disease without esophagitis: Secondary | ICD-10-CM

## 2023-03-31 DIAGNOSIS — J4540 Moderate persistent asthma, uncomplicated: Secondary | ICD-10-CM | POA: Diagnosis not present

## 2023-03-31 DIAGNOSIS — R0982 Postnasal drip: Secondary | ICD-10-CM | POA: Diagnosis not present

## 2023-03-31 DIAGNOSIS — J310 Chronic rhinitis: Secondary | ICD-10-CM

## 2023-03-31 MED ORDER — BREO ELLIPTA 200-25 MCG/ACT IN AEPB: 1.0000 | INHALATION_SPRAY | Freq: Every day | RESPIRATORY_TRACT | 5 refills | Status: DC

## 2023-03-31 MED ORDER — CARBINOXAMINE MALEATE 6 MG PO TABS: 1.0000 | ORAL_TABLET | Freq: Two times a day (BID) | ORAL | 1 refills | Status: DC

## 2023-03-31 MED ORDER — ALBUTEROL SULFATE (2.5 MG/3ML) 0.083% IN NEBU: 2.5000 mg | INHALATION_SOLUTION | RESPIRATORY_TRACT | 5 refills | Status: DC | PRN

## 2023-03-31 NOTE — Progress Notes (Signed)
Follow-up Note  RE: James Dutcher Houchins Sr. MRN: [id number] DOB: Oct 28, 1950 Date of Office Visit: 03/31/2023   History of present illness: James Halaby Woolen Sr. is a 72 y.o. male presenting today for follow-up of asthma, rhinitis, reflux.  He was last seen in the office on 11/26/22 for bronchitis at which time I stepped up his inhaler from [place] to [place].   He states Breo made a big difference in his breathing and worked quickly.  He continues on 1 puff daily.  Since his illness has resolved and using Breo he has not had need for albuterol since.  No further systemic steroids since last visit.  He states every morning he does have to clean out his nose.  He states the nasal sprays do still make a big difference.  The ryvent is effective but is expensive.  He continues on singulair as well.  Famotidine and esomeprazole continues to control his reflux as well.  He states overall he is at a B+ with his allergy/breathing symptom control which is states is a great rating.    Review of systems: 10pt ROS negative unless noted above in HPI  Past medical/social/surgical/family history have been reviewed and are unchanged unless specifically indicated below.  No changes  Medication List: Current Outpatient Medications  Medication Sig Dispense Refill   albuterol (PROVENTIL) (2.5 MG/3ML) 0.083% nebulizer solution Take 3 mLs (2.5 mg total) by nebulization every 4 (four) hours as needed for wheezing or shortness of breath (cough or chest tightness). 75 mL 5   albuterol (VENTOLIN HFA) 108 (90 Base) MCG/ACT inhaler Inhale 2 puffs into the lungs every 6 (six) hours as needed for wheezing or shortness of breath. 18 g 1   atorvastatin (LIPITOR) 40 MG tablet TK 1 T PO  D  3   Beclomethasone Dipropionate (QNASL) 80 MCG/ACT AERS Place 1 spray into both nostrils 2 (two) times daily. 31.8 g 1   BREO ELLIPTA 200-25 MCG/ACT AEPB Inhale 1 puff into the lungs daily. 28 each 5   Carbinoxamine Maleate  (RYVENT) 6 MG TABS Take 1 tablet (6 mg total) by mouth 2 (two) times daily. 120 tablet 1   Continuous Glucose Receiver (FREESTYLE LIBRE 3 READER) DEVI 1 each by Does not apply route daily. Use with sensors to check blood glucose continuously 1 each 0   Continuous Glucose Sensor (FREESTYLE LIBRE 3 SENSOR) MISC Place 1 sensor on the skin every 14 days. Use to check glucose continuously 2 each 2   esomeprazole (NEXIUM) 40 MG capsule Take 1 capsule (40 mg total) by mouth daily before breakfast. 90 capsule 1   famotidine (PEPCID) 10 MG tablet Take 1 tablet (10 mg total) by mouth 2 (two) times daily. 60 tablet 5   gabapentin (NEURONTIN) 600 MG tablet Take 1 tablet (600 mg total) by mouth 4 (four) times daily. 120 tablet 11   levothyroxine (SYNTHROID) 175 MCG tablet Take 1 tablet (175 mcg total) by mouth daily before breakfast. 90 tablet 0   losartan (COZAAR) 100 MG tablet Take 100 mg by mouth daily.     metFORMIN (GLUCOPHAGE) 1000 MG tablet Take 1 tablet (1,000 mg total) by mouth 2 (two) times daily with a meal. 180 tablet 1   montelukast (SINGULAIR) 10 MG tablet Take 1 tablet (10 mg total) by mouth at bedtime. 30 tablet 5   pregabalin (LYRICA) 150 MG capsule Take 1 capsule (150 mg total) by mouth 2 (two) times daily. 180 capsule 0   Semaglutide (RYBELSUS) 7  MG TABS Take 1 tablet (7 mg total) by mouth daily. 90 tablet 0   tolterodine (DETROL LA) 4 MG 24 hr capsule Take 1 capsule (4 mg total) by mouth daily. 90 capsule 0   No current facility-administered medications for this visit.     Known medication allergies: Allergies  Allergen Reactions   Gabapentin Other (See Comments)    Fuzzy headed w/ high doses   Lisinopril Cough   Solifenacin Other (See Comments)    dizziness   Myrbetriq [Mirabegron] Rash     Physical examination: Blood pressure 134/76, pulse 78, temperature 98.4 F (36.9 C), temperature source Temporal, resp. rate 16, SpO2 96%.  General: Alert, interactive, in no acute  distress. HEENT: PERRLA, TMs pearly gray, turbinates non-edematous without discharge, post-pharynx non erythematous. Neck: Supple without lymphadenopathy. Lungs: Clear to auscultation without wheezing, rhonchi or rales. {no increased work of breathing. CV: Normal S1, S2 without murmurs. Abdomen: Nondistended, nontender. Skin: Warm and dry, without lesions or rashes. Extremities:  No clubbing, cyanosis or edema. Neuro:   Grossly intact.  Diagnositics/Labs: Spirometry: FEV1: 2.54 L 72%, FVC: 3.82 l 87%, ratio consistent with nonobstructive pattern for age  Assessment and plan: Rhinitis - can continue Azelastine 2 sprays each nostril twice a day for runny nose control.  Let us know if this becomes ineffective.  - can continue Qnasl 1 spray twice a day for nasal congestion control.  Use for 1-2 weeks at a time before stopping once congestion improves.  - continue Ryvent (carbinaxomine) 4-6mg  1 tab twice a day at this time.  - continue Montelukast 10mg  daily. - if symptoms not improving with above regimen then would recommend skin testing to environmental allergens.  Will need to hold antihistamine for at least 3 days prior to any skin testing visit  Reflux - continue Famotidine and Esomeprazole for reflux control  Asthma - continue Breo 1 puff daily.   - have access to albuterol inhaler 2 puffs OR albuterol 1 vial via nebulier every 4 hours as needed for cough/wheeze/shortness of breath/chest tightness.  May use 15-20 minutes prior to activity.   Monitor frequency of use.    Control goals:  Full participation in all desired activities (may need albuterol before activity) Albuterol use two time or less a week on average (not counting use with activity) Cough interfering with sleep two time or less a month Oral steroids no more than once a year No hospitalizations  Return in about 6 months or sooner if needed  I appreciate the opportunity to take part in [name]'s care. Please do  not hesitate to contact me with questions.  Sincerely,   Margo Aye, MD Allergy/Immunology [facility]

## 2023-03-31 NOTE — Patient Instructions (Signed)
Rhinitis - can continue Azelastine 2 sprays each nostril twice a day for runny nose control.  Let us know if this becomes ineffective.  - can continue Qnasl 1 spray twice a day for nasal congestion control.  Use for 1-2 weeks at a time before stopping once congestion improves.  - continue Ryvent (carbinaxomine) 4-6mg  1 tab twice a day at this time.  - continue Montelukast 10mg  daily. - if symptoms not improving with above regimen then would recommend skin testing to environmental allergens.  Will need to hold antihistamine for at least 3 days prior to any skin testing visit  Reflux - continue Famotidine and Esomeprazole for reflux control  Asthma - continue Breo 1 puff daily.   - have access to albuterol inhaler 2 puffs OR albuterol 1 vial via nebulier every 4 hours as needed for cough/wheeze/shortness of breath/chest tightness.  May use 15-20 minutes prior to activity.   Monitor frequency of use.    Control goals:  Full participation in all desired activities (may need albuterol before activity) Albuterol use two time or less a week on average (not counting use with activity) Cough interfering with sleep two time or less a month Oral steroids no more than once a year No hospitalizations  Return in about 6 months or sooner if needed

## 2023-04-02 NOTE — Addendum Note (Signed)
Addended by: Kellie Simmering, Jaianna Nicoll on: 04/02/2023 12:08 PM   Modules accepted: Orders

## 2023-04-07 ENCOUNTER — Encounter: Payer: Self-pay | Admitting: Internal Medicine

## 2023-04-07 ENCOUNTER — Ambulatory Visit (INDEPENDENT_AMBULATORY_CARE_PROVIDER_SITE_OTHER): Payer: PPO | Admitting: Internal Medicine

## 2023-04-07 VITALS — BP 128/68 | HR 68 | Temp 97.9°F | Resp 18 | Ht 72.0 in | Wt 296.0 lb

## 2023-04-07 DIAGNOSIS — E079 Disorder of thyroid, unspecified: Secondary | ICD-10-CM

## 2023-04-07 DIAGNOSIS — E119 Type 2 diabetes mellitus without complications: Secondary | ICD-10-CM | POA: Diagnosis not present

## 2023-04-07 DIAGNOSIS — E1142 Type 2 diabetes mellitus with diabetic polyneuropathy: Secondary | ICD-10-CM | POA: Diagnosis not present

## 2023-04-07 DIAGNOSIS — Z1211 Encounter for screening for malignant neoplasm of colon: Secondary | ICD-10-CM

## 2023-04-07 DIAGNOSIS — G629 Polyneuropathy, unspecified: Secondary | ICD-10-CM

## 2023-04-07 DIAGNOSIS — Z7984 Long term (current) use of oral hypoglycemic drugs: Secondary | ICD-10-CM

## 2023-04-07 DIAGNOSIS — B370 Candidal stomatitis: Secondary | ICD-10-CM

## 2023-04-07 DIAGNOSIS — I10 Essential (primary) hypertension: Secondary | ICD-10-CM

## 2023-04-07 MED ORDER — NYSTATIN 100000 UNIT/ML MT SUSP: 5.0000 mL | Freq: Four times a day (QID) | OROMUCOSAL | 0 refills | Status: DC
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
## 2023-04-07 NOTE — Patient Instructions (Addendum)
Vaccines I recommend:  Flu shot this fall Covid booster- new this fall Tdap (tetanus) Shingrix (shingles) #2 RSV vaccine   Check the  blood pressure regularly Blood pressure goal:  between 110/65 and  135/85. If it is consistently higher or lower, let me know    Diabetes, blood sugar goals: - early in AM fasting  ( blood sugar goal 70-130) - 2 hours after a meal (blood sugar goal less than 180)   GO TO THE LAB : Get the blood work     GO TO THE FRONT DESK, PLEASE SCHEDULE YOUR APPOINTMENTS Come back for   a checkup in 3 months   Per our records you are due for your diabetic eye exam. Please contact your eye doctor to schedule an appointment. Please have them send copies of your office visit notes to [place]. Our fax number is [phone]. If you need a referral to an eye doctor please let us know.

## 2023-04-08 LAB — LIPID PANEL
Cholesterol: 158 mg/dL (ref 0–200)
HDL: 44.7 mg/dL (ref >39.00)
LDL Cholesterol: 81 mg/dL (ref 0–99)
NonHDL: 113.08
Total CHOL/HDL Ratio: 4
Triglycerides: 162 mg/dL — ABNORMAL HIGH (ref 0.0–149.0)
VLDL: 32.4 mg/dL (ref 0.0–40.0)

## 2023-04-08 LAB — HEMOGLOBIN A1C: Hgb A1c MFr Bld: 6.4 % (ref 4.6–6.5)

## 2023-04-08 LAB — RPR: RPR Ser Ql: NONREACTIVE

## 2023-04-08 NOTE — Assessment & Plan Note (Signed)
DM: Based on last A1c , I increase metformin to 1000 mg twice daily, started Rybelsus, currently on 7 mg daily. Diet and exercise emphasized.  Check A1c. Hypothyroidism: Last TSH 0.03, we decreased levothyroxine to 175 mcg daily, check TSH Asthma, saw allergist 03/31/2023.  Next visit 6 months. Thrush: Reports has had problems on and off, just had a round of nystatin, request a refill. Patient educated about rinsing after use of Breo.  Printed a prescription for nystatin in case he needs it. DM neuropathy: On chart review, saw neurology 04-2022, had a follow-up cervical MRI and NCS. He had exacerbation of symptoms last week, offered reevaluation by neurology.  Patient declined. On chart review I do not see any recent labs.  Will get a B12, folate acid, RPR. Gait disorder, poor balance: Likely multifactorial including BMI, neuropathy, etc.  Offered PT referral, declines, reports he has a Systems analyst twice a week. CCS: Virtual colonoscopy 08/06/2017: Poor quality. Sigmoidoscopy 09/16/2017: Large polyp, pathology-tubular adenoma, excised, next endoscopy 3-year Plan: Overdue for a colonoscopy, referred to GI. GERD: On Nexium and Pepcid, somewhat reluctant to stop one of them.  Will reassess on RTC Preventive care: Recommend the following vaccines: Tdap, Shingrix No. 2, flu shot every fall, COVID booster next month. RTC 3 months.

## 2023-04-09 LAB — B12 AND FOLATE PANEL
Folate: 15.4 ng/mL (ref >5.9)
Vitamin B-12: 271 pg/mL (ref 211–911)

## 2023-04-09 LAB — TSH: TSH: 1.71 u[IU]/mL (ref 0.35–5.50)

## 2023-04-12 ENCOUNTER — Telehealth: Payer: Self-pay | Admitting: Internal Medicine

## 2023-04-12 NOTE — Telephone Encounter (Signed)
PDMP reviewed, it is somewhat early to refill but I sent the prescription.

## 2023-04-12 NOTE — Telephone Encounter (Signed)
Requesting: Lyrica 150mg   Contract: None UDS: None Last Visit: 04/07/23 Next Visit: 06/30/23 Last Refill: 01/20/23 #180 and 0RF  Will get UDS and contract at next OV  Please Advise

## 2023-04-13 LAB — HM DIABETES EYE EXAM

## 2023-04-21 ENCOUNTER — Telehealth: Payer: Self-pay | Admitting: Internal Medicine

## 2023-04-21 DIAGNOSIS — J3089 Other allergic rhinitis: Secondary | ICD-10-CM

## 2023-04-21 DIAGNOSIS — K219 Gastro-esophageal reflux disease without esophagitis: Secondary | ICD-10-CM

## 2023-04-21 MED ORDER — LEVOTHYROXINE SODIUM 175 MCG PO TABS: 175.0000 ug | ORAL_TABLET | Freq: Every day | ORAL | 1 refills | Status: DC

## 2023-04-21 MED ORDER — RYBELSUS 7 MG PO TABS: 7.0000 mg | ORAL_TABLET | Freq: Every day | ORAL | 1 refills | Status: DC

## 2023-04-21 MED ORDER — ESOMEPRAZOLE MAGNESIUM 40 MG PO CPDR
40.0000 mg | DELAYED_RELEASE_CAPSULE | Freq: Every day | ORAL | 1 refills | Status: DC
Start: 2023-04-21 — End: 2023-12-06

## 2023-04-21 MED ORDER — METFORMIN HCL 1000 MG PO TABS: 1000.0000 mg | ORAL_TABLET | Freq: Two times a day (BID) | ORAL | 1 refills | Status: DC

## 2023-04-21 MED ORDER — TOLTERODINE TARTRATE ER 4 MG PO CP24: 4.0000 mg | ORAL_CAPSULE | Freq: Every day | ORAL | 1 refills | Status: DC

## 2023-04-21 MED ORDER — LOSARTAN POTASSIUM 100 MG PO TABS: 100.0000 mg | ORAL_TABLET | Freq: Every day | ORAL | 1 refills | Status: DC

## 2023-04-21 NOTE — Telephone Encounter (Signed)
Pt called stating that he was having an issue with his montelukast being filled. Pt stated that, per his conversations with two different pharmacies, [facility] holds the rights to that medication and [facility] is unable to obtain the rights to it. After reviewing chart, advised that the last script of montelukast was sent to the [facility] but a note would be sent back to investigate this.  Pt also stated that he would like to have [facility] removed off his preferred pharmacies and have his medications sent to the following pharmacy going forward:  [id number] [place],  - [address] AT [facility] [address] [place], [place] [address] Phone: [phone]  Fax: [phone]

## 2023-04-21 NOTE — Addendum Note (Signed)
Addended by: Conrad Apalachicola D on: 04/21/2023 11:40 AM   Modules accepted: Orders

## 2023-04-21 NOTE — Telephone Encounter (Addendum)
Pt needs to call his allergist Dr. Delorse Lek. Please let Pt know I've sent all of his meds that PCP prescribes to [facility] for him.

## 2023-04-23 ENCOUNTER — Other Ambulatory Visit: Payer: Self-pay | Admitting: Internal Medicine

## 2023-04-26 ENCOUNTER — Other Ambulatory Visit: Payer: Self-pay | Admitting: Internal Medicine

## 2023-05-21 ENCOUNTER — Other Ambulatory Visit: Payer: Self-pay | Admitting: Family

## 2023-05-21 ENCOUNTER — Telehealth: Payer: Self-pay | Admitting: Internal Medicine

## 2023-05-21 MED ORDER — LIDOCAINE VISCOUS HCL 2 % MT SOLN: 5.0000 mL | Freq: Three times a day (TID) | OROMUCOSAL | 0 refills | Status: DC | PRN

## 2023-05-21 NOTE — Telephone Encounter (Signed)
Please advise in PCP absence.  

## 2023-05-21 NOTE — Telephone Encounter (Signed)
Pt called to advise that the nystatin oral solution is not working and he said years ago another provider prescribed him something that was in a bottle and resembled pepto bismol that worked really well but he cannot remember the name. Pt wants to know if Dr. Drue Novel Is aware of what this medicine might be and if it can be called in to [facility] on [address]. Please call to discuss.

## 2023-05-26 ENCOUNTER — Telehealth: Payer: Self-pay

## 2023-05-26 DIAGNOSIS — J3089 Other allergic rhinitis: Secondary | ICD-10-CM

## 2023-05-26 MED ORDER — QNASL 80 MCG/ACT NA AERS
1.0000 | INHALATION_SPRAY | Freq: Two times a day (BID) | NASAL | 1 refills | Status: DC
Start: 2023-05-26 — End: 2024-03-15

## 2023-05-26 MED ORDER — FAMOTIDINE 20 MG PO TABS: 20.0000 mg | ORAL_TABLET | Freq: Every day | ORAL | 1 refills | Status: DC

## 2023-05-26 MED ORDER — GABAPENTIN 600 MG PO TABS: 600.0000 mg | ORAL_TABLET | Freq: Four times a day (QID) | ORAL | 1 refills | Status: DC

## 2023-05-26 MED ORDER — MONTELUKAST SODIUM 10 MG PO TABS: 10.0000 mg | ORAL_TABLET | Freq: Every day | ORAL | 5 refills | Status: DC

## 2023-05-26 NOTE — Telephone Encounter (Signed)
Okay to refill gabapentin

## 2023-05-26 NOTE — Telephone Encounter (Signed)
Patient called in - DOB/Pharmacy verified - stated [facility] has closed - requesting new medication prescriptions for the following be sent [facility]:  Montelukast Famotidine Breo Ellipta Qnasl  Patient advised Earlie Server prescription was sent to [facility] on 03/31/23 w/ RF 5. I will send in new prescriptions for : Qnasl, Montelukast and Famotidine.  Patient verbalized understanding, no further questions.

## 2023-05-26 NOTE — Telephone Encounter (Signed)
Spoke w/ Pt- informed Rx has been sent.

## 2023-05-26 NOTE — Telephone Encounter (Signed)
Received call from Pt- he wanted to know why meds haven't been sent from [facility] to [facility]. Informed him multiple meds were sent on 04/21/23, however allergy meds such as (Breo, montelukast, famotidine) is refilled by Dr. Delorse Lek- their office will have to send those. He is also needing a refill on gabapentin (last filled per [facility] 04/2022 by Dr. Terrace Arabia at neuro)- Pt states Dr Valentina Lucks previously prescribed this that he only saw Dr. Terrace Arabia once, he is wanting to know if PCP would be willing to refill gabapentin.

## 2023-05-26 NOTE — Telephone Encounter (Signed)
Rx sent 

## 2023-05-28 ENCOUNTER — Telehealth: Payer: Self-pay | Admitting: Internal Medicine

## 2023-05-28 NOTE — Telephone Encounter (Signed)
Pt called and stated that he wanted to speak to a nurse regarding his prescriptions. Pt refused to elaborate on what he needed to discuss. Please call and advise pt.

## 2023-05-31 NOTE — Telephone Encounter (Signed)
Tried calling Pt, no answer, unable to leave message.

## 2023-06-01 ENCOUNTER — Ambulatory Visit: Payer: PPO | Admitting: Family Medicine

## 2023-06-01 ENCOUNTER — Encounter: Payer: Self-pay | Admitting: Family Medicine

## 2023-06-01 VITALS — BP 140/88 | HR 81 | Temp 98.1°F | Resp 18 | Ht 72.0 in | Wt 299.4 lb

## 2023-06-01 DIAGNOSIS — H9193 Unspecified hearing loss, bilateral: Secondary | ICD-10-CM | POA: Diagnosis not present

## 2023-06-01 DIAGNOSIS — H6523 Chronic serous otitis media, bilateral: Secondary | ICD-10-CM

## 2023-06-01 NOTE — Patient Instructions (Signed)
Earwax Buildup, Adult  Your ears make something called earwax. It helps keep germs called bacteria away and protects the skin in your ears. Sometimes, too much earwax can build up. This can cause discomfort or make it harder to hear.  What are the causes?  Earwax buildup can happen when you have too much earwax in your ears. Earwax is made in the outer part of your ear canal. It's supposed to fall out in small amounts over time.  But if your ears aren't able to clean themselves like they should, earwax can build up.  What increases the risk?  You're more likely to get earwax buildup if:  You clean your ears with cotton swabs.  You pick at your ears.  You use earplugs or in-ear headphones a lot.  You wear hearing aids.  You may also be more likely to get it if:  You're male.  You're older.  Your ears naturally make more earwax.  You have narrow ear canals or extra hair in your ears.  Your earwax is too thick or sticky.  You have eczema.  You're dehydrated. This means there's not enough fluid in your body.  What are the signs or symptoms?  Symptoms of earwax buildup include:  Not being able to hear as well.  A feeling of fullness in your ear.  Feeling like your ear is plugged.  Fluid coming from your ear.  Ear pain or an itchy ear.  Ringing in your ear.  Coughing or problems with balance.  How is this diagnosed?  Earwax buildup may be diagnosed based on your symptoms, medical history, and an ear exam. During the exam, your health care provider will look into your ear with a tool called an otoscope.  You may also have tests, such as a hearing test.  How is this treated?  Earwax buildup may be treated by:  Using ear drops.  Having the earwax removed by a provider. The provider may:  Flush the ear with water.  Use a tool called a curette that has a loop on the end.  Use a suction device.  Having surgery. This may be done in severe cases.  Follow these instructions at home:    Cleaning your ears  Clean your ears as told  by your provider. You can clean the outside of your ears with a washcloth or tissue.  Do not overclean your ears.  Do not put anything into your ear unless told. This includes cotton swabs.  General instructions  Take over-the-counter and prescription medicines only as told by your provider.  Drink enough fluid to keep your pee (urine) pale yellow. This helps thin the earwax.  If you have hearing aids, clean them as told.  Keep all follow-up visits. If earwax builds up in your ears often or if you use hearing aids, ask your provider how often you should have your ears cleaned.  Contact a health care provider if:  Your ear pain gets worse.  You have a fever.  You have pus, blood, or other fluid coming from your ear.  You have hearing loss.  You have ringing in your ears that won't go away.  You feel like the room is spinning. This is called vertigo.  Your symptoms don't get better with treatment.  This information is not intended to replace advice given to you by your health care provider. Make sure you discuss any questions you have with your health care provider.  Document Revised: 10/15/2022 Document Reviewed:  10/15/2022  Elsevier Patient Education  2024 [employer].
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
## 2023-06-29 ENCOUNTER — Encounter: Payer: Self-pay | Admitting: Internal Medicine

## 2023-06-29 ENCOUNTER — Ambulatory Visit: Payer: PPO | Admitting: Internal Medicine

## 2023-06-29 VITALS — BP 136/80 | HR 74 | Temp 97.8°F | Resp 18 | Ht 72.0 in | Wt 292.2 lb

## 2023-06-29 DIAGNOSIS — S86811A Strain of other muscle(s) and tendon(s) at lower leg level, right leg, initial encounter: Secondary | ICD-10-CM
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
## 2023-06-29 NOTE — Patient Instructions (Addendum)
Will refer you to the Sports medicine specialist.  For pain: - Ice the area twice a day - Apply Voltaren gel over-the-counter: 2-3 times a day - Tylenol  500 mg OTC 2 tabs a day every 8 hours as needed for pain  Call if the area gets more swollen or painful     Per our records you are due for your diabetic eye exam. Please contact your eye doctor to schedule an appointment. Please have them send copies of your office visit notes to [place]. Our fax number is [phone]. If you need a referral to an eye doctor please let us know.

## 2023-06-30 ENCOUNTER — Other Ambulatory Visit: Payer: Self-pay

## 2023-06-30 ENCOUNTER — Ambulatory Visit: Payer: PPO | Admitting: Sports Medicine

## 2023-06-30 ENCOUNTER — Ambulatory Visit: Payer: PPO | Admitting: Internal Medicine

## 2023-06-30 VITALS — BP 132/80 | HR 75 | Ht 72.0 in | Wt 294.0 lb

## 2023-06-30 DIAGNOSIS — S86811A Strain of other muscle(s) and tendon(s) at lower leg level, right leg, initial encounter: Secondary | ICD-10-CM

## 2023-06-30 MED ORDER — MELOXICAM 15 MG PO TABS: 15.0000 mg | ORAL_TABLET | Freq: Every day | ORAL | 0 refills | Status: DC

## 2023-06-30 NOTE — Progress Notes (Signed)
James Henson D.[facility] [address] [place] [address] Phone: [phone]   Assessment and Plan:     1. Strain of calf muscle, right, initial encounter -Acute, initial sports medicine visit - Most consistent with grade 1 strain of lateral distal gastrocnemius muscle near musculotendinous junction based on HPI, physical exam, ultrasound imaging -Recommend using boot over the next 1 to 3 weeks with goal of pain-free ambulation.  May gradually discontinue boot as tolerated.  Boot provided in clinic - Start HEP for Achilles and gastrocnemius.  HEP should be pain-free to prevent worsening of strain - Start meloxicam 15 mg daily x2 weeks.  If still having pain after 2 weeks, complete 3rd-week of meloxicam. May use remaining meloxicam as needed once daily for pain control.  Do not to use additional NSAIDs while taking meloxicam.  May use Tylenol [phone] mg 2 to 3 times a day for breakthrough pain.  15 additional minutes spent for educating Therapeutic Home Exercise Program.  This included exercises focusing on stretching, strengthening, with focus on eccentric aspects.   Long term goals include an improvement in range of motion, strength, endurance as well as avoiding reinjury. Patient's frequency would include in 1-2 times a day, 3-5 times a week for a duration of 6-12 weeks. Proper technique shown and discussed handout in great detail with ATC.  All questions were discussed and answered.  Sports Medicine: Musculoskeletal Ultrasound. Exam: Limited US of right calf Diagnosis: Calf strain  US Findings: Achilles tendon intact without partial or complete tearing.  No effusion at Achilles tendon insertion on calcaneus.  Mild superficial effusion and subcutaneous layer around posterior calf and musculotendinous junction.  Deep fibers disruption to lateral gastrocnemius without tearing or hematoma.  Veins compressible  US Impression:  Grade 1 lateral  gastrocnemius strain  Pertinent previous records reviewed include family medicine note 06/29/2023  Follow Up: 4 weeks for reevaluation.  If no improvement or worsening of symptoms, could obtain ankle x-ray, repeat ultrasound, discuss advanced imaging, consider physical therapy   Subjective:   I, James Henson, am serving as a Neurosurgeon for Doctor Richardean Sale  Chief Complaint: right calf pain   HPI:   06/30/23 Patient is 72 year old male with concerns of right calf pain. Patient states 5 to 6 days ago was taking his routine walk with his wife and suddenly felt a "pop" at the right leg.Immediately developed pain and some swelling at the right calf.Denies any falls.The pain is worse with certain movements such as stretching the leg.Although the pain is severe, has not taken any medication for it  Relevant Historical Information: Hypertension, OSA, GERD, DM type II  Additional pertinent review of systems negative.   Current Outpatient Medications:    albuterol (PROVENTIL) (2.5 MG/3ML) 0.083% nebulizer solution, Take 3 mLs (2.5 mg total) by nebulization every 4 (four) hours as needed for wheezing or shortness of breath (cough or chest tightness)., Disp: 75 mL, Rfl: 5   albuterol (VENTOLIN HFA) 108 (90 Base) MCG/ACT inhaler, Inhale 2 puffs into the lungs every 6 (six) hours as needed for wheezing or shortness of breath., Disp: 18 g, Rfl: 1   atorvastatin (LIPITOR) 40 MG tablet, TK 1 T PO  D, Disp: , Rfl: 3   Beclomethasone Dipropionate (QNASL) 80 MCG/ACT AERS, Place 1 spray into both nostrils 2 (two) times daily., Disp: 31.8 g, Rfl: 1   BREO ELLIPTA 200-25 MCG/ACT AEPB, Inhale 1 puff into the lungs daily., Disp: 28 each, Rfl:  5   Carbinoxamine Maleate (RYVENT) 6 MG TABS, Take 1 tablet (6 mg total) by mouth 2 (two) times daily., Disp: 120 tablet, Rfl: 1   Continuous Glucose Receiver (FREESTYLE LIBRE 3 READER) DEVI, 1 each by Does not apply route daily. Use with sensors to check blood glucose  continuously, Disp: 1 each, Rfl: 0   Continuous Glucose Sensor (FREESTYLE LIBRE 3 SENSOR) MISC, Place 1 sensor on the skin every 14 days. Use to check glucose continuously, Disp: 2 each, Rfl: 2   esomeprazole (NEXIUM) 40 MG capsule, Take 1 capsule (40 mg total) by mouth daily before breakfast., Disp: 90 capsule, Rfl: 1   famotidine (PEPCID) 20 MG tablet, Take 1 tablet (20 mg total) by mouth daily., Disp: 90 tablet, Rfl: 1   gabapentin (NEURONTIN) 600 MG tablet, Take 1 tablet (600 mg total) by mouth 4 (four) times daily., Disp: 360 tablet, Rfl: 1   levothyroxine (SYNTHROID) 175 MCG tablet, Take 1 tablet (175 mcg total) by mouth daily before breakfast., Disp: 90 tablet, Rfl: 1   losartan (COZAAR) 100 MG tablet, Take 1 tablet (100 mg total) by mouth daily., Disp: 90 tablet, Rfl: 1   magic mouthwash (lidocaine, diphenhydrAMINE, alum & mag hydroxide) suspension, Swish and swallow 5 mLs 3 (three) times daily as needed for mouth pain., Disp: 360 mL, Rfl: 0   meloxicam (MOBIC) 15 MG tablet, Take 1 tablet (15 mg total) by mouth daily., Disp: 30 tablet, Rfl: 0   metFORMIN (GLUCOPHAGE) 1000 MG tablet, Take 1 tablet (1,000 mg total) by mouth 2 (two) times daily with a meal., Disp: 180 tablet, Rfl: 1   montelukast (SINGULAIR) 10 MG tablet, Take 1 tablet (10 mg total) by mouth at bedtime., Disp: 30 tablet, Rfl: 5   nystatin (MYCOSTATIN) 100000 UNIT/ML suspension, Take 5 mLs (500,000 Units total) by mouth 4 (four) times daily., Disp: 60 mL, Rfl: 0   pregabalin (LYRICA) 150 MG capsule, TAKE ONE CAPSULE BY MOUTH TWICE DAILY, Disp: 180 capsule, Rfl: 0   Semaglutide (RYBELSUS) 7 MG TABS, Take 1 tablet (7 mg total) by mouth daily., Disp: 90 tablet, Rfl: 1   tolterodine (DETROL LA) 4 MG 24 hr capsule, Take 1 capsule (4 mg total) by mouth daily., Disp: 90 capsule, Rfl: 1   Objective:     Vitals:   06/30/23 1430  BP: 132/80  Pulse: 75  SpO2: 100%  Weight: 294 lb (133.4 kg)  Height: 6' (1.829 m)      Body mass  index is 39.87 kg/m.    Physical Exam:    Gen: Appears well, nad, nontoxic and pleasant Psych: Alert and oriented, appropriate mood and affect Neuro: sensation intact, strength is 5/5 with df/pf/inv/ev, muscle tone wnl Skin: no susupicious lesions or rashes  Right leg/ankle:  No deformity, no swelling or effusion TTP medial, lateral, central gastrocnemius, proximal Achilles tendon NTTP over fibular head, lat mal, medial mal, middle/distal achilles, navicular, base of 5th, ATFL, CFL, deltoid, calcaneous or midfoot ROM DF 30, PF 45, inv/ev intact Negative ant drawer, talar tilt, rotation test, squeeze test. Neg thompson No pain with resisted inversion or eversion  No pain with resisted dorsiflexion.  Mild pain with resisted plantarflexion  Electronically signed by:  James Henson D.[facility] 3:03 PM 06/30/23

## 2023-06-30 NOTE — Patient Instructions (Signed)
-   Start meloxicam 15 mg daily x2 weeks.  If still having pain after 2 weeks, complete 3rd-week of meloxicam. May use remaining meloxicam as needed once daily for pain control.  Do not to use additional NSAIDs while taking meloxicam.  May use Tylenol [phone] mg 2 to 3 times a day for breakthrough pain. Calf HEP  Exercises should be pain free Boot at all time can start to transition out of boot in 2 weeks with goal of pain free walking  4 week follow up

## 2023-06-30 NOTE — Assessment & Plan Note (Signed)
Calf strain. Symptoms are described above, suspect moderate to strain, Achilles tendon seems intact. No evidence of DVT on clinical grounds but the patient will let me know if the calf gets swollen, red or more tender. Plan: Sports medicine referral, Voltaren gel, Tylenol, ice.

## 2023-07-01 ENCOUNTER — Encounter: Payer: Self-pay | Admitting: Internal Medicine

## 2023-07-19 ENCOUNTER — Ambulatory Visit: Payer: PPO | Admitting: Sports Medicine

## 2023-07-19 ENCOUNTER — Ambulatory Visit: Payer: PPO

## 2023-07-19 VITALS — HR 77 | Ht 72.0 in | Wt 304.0 lb

## 2023-07-19 DIAGNOSIS — S86811D Strain of other muscle(s) and tendon(s) at lower leg level, right leg, subsequent encounter: Secondary | ICD-10-CM | POA: Diagnosis not present

## 2023-07-19 NOTE — Progress Notes (Signed)
Aleen Sells D[facility] [address] [place] [address] Phone: [phone]   Assessment and Plan:     1. Strain of calf muscle, right, subsequent encounter - Acute, minimal improvement, subsequent visit - Still consistent with strain of right gastrocnemius muscle, however pain has moved more proximally and muscle and less tender at musculotendinous junction at today's visit - Patient is placed HEP instructions.  New HEP printed out and provided.  Recommend doing daily - Continue meloxicam 15 mg daily for additional 10 days and then discontinue medication.  After discontinuing meloxicam, may use Tylenol/ibuprofen as needed for pain relief - Recommend using heat over calf to provide muscle relaxation -Recommend using boot whenever ambulatory for the next 3 to 4 weeks and gradual discontinuation  Pertinent previous records reviewed include none  Follow Up: As needed in 3 to 4 weeks if no improvement or worsening of symptoms.  Could discuss ultrasound versus physical therapy versus advanced imaging   Subjective:   I, Moenique Parris, am serving as a Neurosurgeon for Doctor Richardean Sale   Chief Complaint: right calf pain    HPI:    06/30/23 Patient is 72 year old male with concerns of right calf pain. Patient states 5 to 6 days ago was taking his routine walk with his wife and suddenly felt a "pop" at the right leg.Immediately developed pain and some swelling at the right calf.Denies any falls.The pain is worse with certain movements such as stretching the leg.Although the pain is severe, has not taken any medication for it  07/19/2023 Patient states that he thinks his pain could be coming from his knee replacement. Does endorse antalgic gait. Has been taking meloxicam. Wears the boot on and off . Pain is located to the calf and lateral knee    Relevant Historical Information: Hypertension, OSA, GERD, DM type II    Additional pertinent review  of systems negative.   Current Outpatient Medications:    albuterol (PROVENTIL) (2.5 MG/3ML) 0.083% nebulizer solution, Take 3 mLs (2.5 mg total) by nebulization every 4 (four) hours as needed for wheezing or shortness of breath (cough or chest tightness)., Disp: 75 mL, Rfl: 5   albuterol (VENTOLIN HFA) 108 (90 Base) MCG/ACT inhaler, Inhale 2 puffs into the lungs every 6 (six) hours as needed for wheezing or shortness of breath., Disp: 18 g, Rfl: 1   atorvastatin (LIPITOR) 40 MG tablet, TK 1 T PO  D, Disp: , Rfl: 3   Beclomethasone Dipropionate (QNASL) 80 MCG/ACT AERS, Place 1 spray into both nostrils 2 (two) times daily., Disp: 31.8 g, Rfl: 1   BREO ELLIPTA 200-25 MCG/ACT AEPB, Inhale 1 puff into the lungs daily., Disp: 28 each, Rfl: 5   Carbinoxamine Maleate (RYVENT) 6 MG TABS, Take 1 tablet (6 mg total) by mouth 2 (two) times daily., Disp: 120 tablet, Rfl: 1   Continuous Glucose Receiver (FREESTYLE LIBRE 3 READER) DEVI, 1 each by Does not apply route daily. Use with sensors to check blood glucose continuously, Disp: 1 each, Rfl: 0   Continuous Glucose Sensor (FREESTYLE LIBRE 3 SENSOR) MISC, Place 1 sensor on the skin every 14 days. Use to check glucose continuously, Disp: 2 each, Rfl: 2   esomeprazole (NEXIUM) 40 MG capsule, Take 1 capsule (40 mg total) by mouth daily before breakfast., Disp: 90 capsule, Rfl: 1   famotidine (PEPCID) 20 MG tablet, Take 1 tablet (20 mg total) by mouth daily., Disp: 90 tablet, Rfl: 1   gabapentin (  NEURONTIN) 600 MG tablet, Take 1 tablet (600 mg total) by mouth 4 (four) times daily., Disp: 360 tablet, Rfl: 1   levothyroxine (SYNTHROID) 175 MCG tablet, Take 1 tablet (175 mcg total) by mouth daily before breakfast., Disp: 90 tablet, Rfl: 1   losartan (COZAAR) 100 MG tablet, Take 1 tablet (100 mg total) by mouth daily., Disp: 90 tablet, Rfl: 1   magic mouthwash (lidocaine, diphenhydrAMINE, alum & mag hydroxide) suspension, Swish and swallow 5 mLs 3 (three) times daily  as needed for mouth pain., Disp: 360 mL, Rfl: 0   meloxicam (MOBIC) 15 MG tablet, Take 1 tablet (15 mg total) by mouth daily., Disp: 30 tablet, Rfl: 0   metFORMIN (GLUCOPHAGE) 1000 MG tablet, Take 1 tablet (1,000 mg total) by mouth 2 (two) times daily with a meal., Disp: 180 tablet, Rfl: 1   montelukast (SINGULAIR) 10 MG tablet, Take 1 tablet (10 mg total) by mouth at bedtime., Disp: 30 tablet, Rfl: 5   nystatin (MYCOSTATIN) 100000 UNIT/ML suspension, Take 5 mLs (500,000 Units total) by mouth 4 (four) times daily., Disp: 60 mL, Rfl: 0   pregabalin (LYRICA) 150 MG capsule, TAKE ONE CAPSULE BY MOUTH TWICE DAILY, Disp: 180 capsule, Rfl: 0   Semaglutide (RYBELSUS) 7 MG TABS, Take 1 tablet (7 mg total) by mouth daily., Disp: 90 tablet, Rfl: 1   tolterodine (DETROL LA) 4 MG 24 hr capsule, Take 1 capsule (4 mg total) by mouth daily., Disp: 90 capsule, Rfl: 1   Objective:     Vitals:   07/19/23 1427  Pulse: 77  SpO2: 99%  Weight: (!) 304 lb (137.9 kg)  Height: 6' (1.829 m)      Body mass index is 41.23 kg/m.    Physical Exam:    Gen: Appears well, nad, nontoxic and pleasant Psych: Alert and oriented, appropriate mood and affect Neuro: sensation intact, strength is 5/5 with df/pf/inv/ev, muscle tone wnl Skin: no susupicious lesions or rashes   Right leg/ankle:  No deformity, no swelling or effusion TTP medial, lateral, central gastrocnemius, Gerdy's tubercle NTTP over fibular head, lat mal, medial mal, middle/distal achilles, navicular, base of 5th, ATFL, CFL, deltoid, calcaneous or midfoot, medial lateral knee joint line, patella Ankle ROM DF 30, PF 45, inv/ev intact Negative ant drawer, talar tilt, rotation test, squeeze test. Neg thompson No pain with resisted inversion or eversion    pain with resisted dorsiflexion and posterior calf.  No pain with resisted plantarflexion  Electronically signed by:  Aleen Sells [facility] 2:54 PM 07/19/23

## 2023-07-19 NOTE — Patient Instructions (Signed)
Complete course of meloxicam and then discontinue After stopping meloxicam can use tylenol and ibuprofen as needed Calf HEP recommend doing at least 1x a day  Use heat over calf As needed follow up , if no improvement 3-4  week follow up

## 2023-07-27 NOTE — Progress Notes (Unsigned)
Aleen [facility]ells [facility] [address] [place] [address] Phone: [phone]   Assessment and Plan:     There are no diagnoses linked to this encounter.  ***   Pertinent previous records reviewed include ***    Follow Up: ***     Subjective:   I, James Henson, am serving as a Neurosurgeon for Doctor Richardean Sale   Chief Complaint: right calf pain    HPI:    06/30/23 Patient is 72 year old male with concerns of right calf pain. Patient states 5 to 6 days ago was taking his routine walk with his wife and suddenly felt a "pop" at the right leg.Immediately developed pain and some swelling at the right calf.Denies any falls.The pain is worse with certain movements such as stretching the leg.Although the pain is severe, has not taken any medication for it   07/19/2023 Patient states that he thinks his pain could be coming from his knee replacement. Does endorse antalgic gait. Has been taking meloxicam. Wears the boot on and off . Pain is located to the calf and lateral knee   07/28/2023 Patient states   Relevant Historical Information: Hypertension, OSA, GERD, DM type II  Additional pertinent review of systems negative.   Current Outpatient Medications:    albuterol (PROVENTIL) (2.5 MG/3ML) 0.083% nebulizer solution, Take 3 mLs (2.5 mg total) by nebulization every 4 (four) hours as needed for wheezing or shortness of breath (cough or chest tightness)., Disp: 75 mL, Rfl: 5   albuterol (VENTOLIN HFA) 108 (90 Base) MCG/ACT inhaler, Inhale 2 puffs into the lungs every 6 (six) hours as needed for wheezing or shortness of breath., Disp: 18 g, Rfl: 1   atorvastatin (LIPITOR) 40 MG tablet, TK 1 T PO  D, Disp: , Rfl: 3   Beclomethasone Dipropionate (QNASL) 80 MCG/ACT AERS, Place 1 spray into both nostrils 2 (two) times daily., Disp: 31.8 g, Rfl: 1   BREO ELLIPTA 200-25 MCG/ACT AEPB, Inhale 1 puff into the lungs daily., Disp: 28 each, Rfl: 5    Carbinoxamine Maleate (RYVENT) 6 MG TABS, Take 1 tablet (6 mg total) by mouth 2 (two) times daily., Disp: 120 tablet, Rfl: 1   Continuous Glucose Receiver (FREESTYLE LIBRE 3 READER) DEVI, 1 each by Does not apply route daily. Use with sensors to check blood glucose continuously, Disp: 1 each, Rfl: 0   Continuous Glucose Sensor (FREESTYLE LIBRE 3 SENSOR) MISC, Place 1 sensor on the skin every 14 days. Use to check glucose continuously, Disp: 2 each, Rfl: 2   esomeprazole (NEXIUM) 40 MG capsule, Take 1 capsule (40 mg total) by mouth daily before breakfast., Disp: 90 capsule, Rfl: 1   famotidine (PEPCID) 20 MG tablet, Take 1 tablet (20 mg total) by mouth daily., Disp: 90 tablet, Rfl: 1   gabapentin (NEURONTIN) 600 MG tablet, Take 1 tablet (600 mg total) by mouth 4 (four) times daily., Disp: 360 tablet, Rfl: 1   levothyroxine (SYNTHROID) 175 MCG tablet, Take 1 tablet (175 mcg total) by mouth daily before breakfast., Disp: 90 tablet, Rfl: 1   losartan (COZAAR) 100 MG tablet, Take 1 tablet (100 mg total) by mouth daily., Disp: 90 tablet, Rfl: 1   magic mouthwash (lidocaine, diphenhydrAMINE, alum & mag hydroxide) suspension, Swish and swallow 5 mLs 3 (three) times daily as needed for mouth pain., Disp: 360 mL, Rfl: 0   meloxicam (MOBIC) 15 MG tablet, Take 1 tablet (15 mg total) by mouth daily., Disp: 30  tablet, Rfl: 0   metFORMIN (GLUCOPHAGE) 1000 MG tablet, Take 1 tablet (1,000 mg total) by mouth 2 (two) times daily with a meal., Disp: 180 tablet, Rfl: 1   montelukast (SINGULAIR) 10 MG tablet, Take 1 tablet (10 mg total) by mouth at bedtime., Disp: 30 tablet, Rfl: 5   nystatin (MYCOSTATIN) 100000 UNIT/ML suspension, Take 5 mLs (500,000 Units total) by mouth 4 (four) times daily., Disp: 60 mL, Rfl: 0   pregabalin (LYRICA) 150 MG capsule, TAKE ONE CAPSULE BY MOUTH TWICE DAILY, Disp: 180 capsule, Rfl: 0   Semaglutide (RYBELSUS) 7 MG TABS, Take 1 tablet (7 mg total) by mouth daily., Disp: 90 tablet, Rfl: 1    tolterodine (DETROL LA) 4 MG 24 hr capsule, Take 1 capsule (4 mg total) by mouth daily., Disp: 90 capsule, Rfl: 1   Objective:     There were no vitals filed for this visit.    There is no height or weight on file to calculate BMI.    Physical Exam:    ***   Electronically signed by:  Aleen [facility] 11:30 AM 07/27/23

## 2023-07-28 ENCOUNTER — Ambulatory Visit: Payer: PPO | Admitting: Sports Medicine

## 2023-08-16 ENCOUNTER — Ambulatory Visit (INDEPENDENT_AMBULATORY_CARE_PROVIDER_SITE_OTHER): Payer: PPO | Admitting: Internal Medicine

## 2023-08-16 VITALS — BP 136/86 | HR 71 | Temp 97.5°F | Resp 18 | Ht 72.0 in | Wt 302.1 lb

## 2023-08-16 DIAGNOSIS — E119 Type 2 diabetes mellitus without complications: Secondary | ICD-10-CM

## 2023-08-16 DIAGNOSIS — I10 Essential (primary) hypertension: Secondary | ICD-10-CM

## 2023-08-16 DIAGNOSIS — E079 Disorder of thyroid, unspecified: Secondary | ICD-10-CM

## 2023-08-16 DIAGNOSIS — Z7984 Long term (current) use of oral hypoglycemic drugs: Secondary | ICD-10-CM

## 2023-08-16 DIAGNOSIS — M545 Low back pain, unspecified: Secondary | ICD-10-CM

## 2023-08-16 MED ORDER — MELOXICAM 15 MG PO TABS: 15.0000 mg | ORAL_TABLET | Freq: Every day | ORAL | 0 refills | Status: DC | PRN

## 2023-08-16 MED ORDER — CYCLOBENZAPRINE HCL 10 MG PO TABS: 10.0000 mg | ORAL_TABLET | Freq: Three times a day (TID) | ORAL | 0 refills | Status: DC | PRN

## 2023-08-16 NOTE — Patient Instructions (Addendum)
Please get blood work done today or tomorrow  For back pain: Tylenol  500 mg OTC 2 tabs a day every 8 hours as needed for pain Meloxicam 1 tablet a day as needed only if Tylenol is not helping enough.  Always take it with food to prevent stomach irritation. Take Flexeril, a muscle relaxant up to every 8 hours.  Watch for drowsiness. Call if not gradually better  Next visit in 3 months, please make an appointment   Per our records you are due for your diabetic eye exam. Please contact your eye doctor to schedule an appointment. Please have them send copies of your office visit notes to [place]. Our fax number is [phone]. If you need a referral to an eye doctor please let us know.

## 2023-08-17 LAB — BASIC METABOLIC PANEL
BUN: 22 mg/dL (ref 6–23)
CO2: 27 meq/L (ref 19–32)
Calcium: 9 mg/dL (ref 8.4–10.5)
Chloride: 102 meq/L (ref 96–112)
Creatinine, Ser: 1.1 mg/dL (ref 0.40–1.50)
GFR: 66.38 mL/min (ref >60.00)
Glucose, Bld: 83 mg/dL (ref 70–99)
Potassium: 4.3 meq/L (ref 3.5–5.1)
Sodium: 139 meq/L (ref 135–145)

## 2023-08-17 LAB — HEMOGLOBIN A1C: Hgb A1c MFr Bld: 6.5 % (ref 4.6–6.5)

## 2023-08-17 NOTE — Assessment & Plan Note (Signed)
 Recurrent lumbalgia: Has episodes twice a year, this time he is not improving as usual. Recommend a x-ray, patient declined it but we agreed that he will proceed if he is not improving. Plan: Flexeril  3 times daily, watch for somnolence. Tylenol  as needed for pain Meloxicam  (tolerated well before) once daily as needed only with food. Call if not gradually better (This is an acute visit but we also address his chronic medical problems)  DM:No ambulatory CBGs, last A1c satisfactory, currently on metformin , Rybelsus , check A1c HTN: Ambulatory BPs in the 120s over 80s.  BP today satisfactory, continue losartan . Hypothyroidism: On Synthroid , check TSH. RTC 3 months.

## 2023-08-19 ENCOUNTER — Telehealth: Payer: Self-pay

## 2023-08-19 DIAGNOSIS — G4733 Obstructive sleep apnea (adult) (pediatric): Secondary | ICD-10-CM

## 2023-08-19 NOTE — Addendum Note (Signed)
 Addended by: Conrad Liborio Negron Torres D on: 08/19/2023 11:38 AM   Modules accepted: Orders

## 2023-08-19 NOTE — Telephone Encounter (Signed)
 Copied from CRM [id number]. Topic: Clinical - Medical Advice >> Aug 19, 2023  4:03 PM Alona Bene A wrote: Reason for CRM: Patient called in requesting phone call from provider to discuss the usage of Ozempic. Please contact patient at [phone].

## 2023-08-19 NOTE — Telephone Encounter (Signed)
 Copied from CRM [id number]. Topic: Clinical - Medical Advice >> Aug 19, 2023 11:23 AM Adelina Mings wrote: Reason for CRM: Pt c-pap machine broke and needs another one

## 2023-08-19 NOTE — Telephone Encounter (Addendum)
 Please advise- Pt currently on Rybelsus. He would like to switch to Ozempic.

## 2023-08-19 NOTE — Telephone Encounter (Signed)
 Referral placed to pulmonary- Dr. Drue Novel does not prescribe CPAP supplies

## 2023-08-19 NOTE — Telephone Encounter (Signed)
 Mychart message sent to Pt to make him aware of pulm referral.

## 2023-08-20 ENCOUNTER — Telehealth: Payer: Self-pay

## 2023-08-20 ENCOUNTER — Other Ambulatory Visit (HOSPITAL_COMMUNITY): Payer: Self-pay

## 2023-08-20 MED ORDER — SEMAGLUTIDE (1 MG/DOSE) 4 MG/3ML ~~LOC~~ SOPN: 1.0000 mg | PEN_INJECTOR | SUBCUTANEOUS | 3 refills | Status: DC

## 2023-08-20 NOTE — Telephone Encounter (Signed)
 Mychart message sent to Pt, we messaged several times yesterday. He is aware that Ozempic and Rybelsus are the same but he is requesting to switch to injectable. Rx sent to [facility] and message sent to Pt.

## 2023-08-20 NOTE — Telephone Encounter (Signed)
 Advised patient: Rybelsus and Ozempic are the same medication. Nevertheless, if he likes to change we could send him Ozempic 1 mg injection weekly.  Just let us know. Also advise  that insurance coverage may not be the same.

## 2023-08-20 NOTE — Telephone Encounter (Signed)
 PA initiated via Covermymeds; KEY: [id number].   PA approved.   03-JAN-25:03-JAN-26 Ozempic (1 MG/DOSE) 4MG /3ML [place] SOPN Quantity:3;

## 2023-08-20 NOTE — Addendum Note (Signed)
 Addended by: Conrad Stratmoor D on: 08/20/2023 10:04 AM   Modules accepted: Orders

## 2023-08-27 ENCOUNTER — Ambulatory Visit: Payer: Self-pay | Admitting: Internal Medicine

## 2023-08-27 NOTE — Telephone Encounter (Signed)
 Chief Complaint: constipation Symptoms: no BM  Frequency: 10 days, normally an every other day type person Pertinent Negatives: Patient denies fever, denies abd pain, denies N/V,  Disposition: [] ED /[] Urgent Care (no appt availability in office) / [] Appointment(In office/virtual)/ []  [facility]/ [x] Home Care/ [] Refused Recommended Disposition /[] [facility]/ []  Follow-up with PCP Additional Notes: Pt states that he is still producing gas. States he has had issues with constipation in the past with use of pain medication. Pt using colace already. Advised to use miralax  and dulcolax per the home care instructions. Pt advised to call back if worsening s/s of constipation. Pt agreeable and understands. Pt states that he has decreased activity d/t hip pain that he has been suffering but has increased the water intake.   Copied from CRM [id number]. Topic: Clinical - Pink Word Triage >> Aug 27, 2023 10:21 AM Pinkey ORN wrote: Reason for Triage: Constipation >> Aug 27, 2023 10:24 AM Pinkey ORN wrote: Patient states he's been on medication prior to hip issues, the medication he's taken has caused him to be constipated and he's wanting medical advise as far as what medication he can pick up from the drug store that may help give him some relief.  Reason for Disposition  Over-The-Counter (OTC) medicines for constipation, questions about  Answer Assessment - Initial Assessment Questions 1. STOOL PATTERN OR FREQUENCY: How often do you have a bowel movement (BM)?  (Normal range: 3 times a day to every 3 days)  When was your last BM?       Normally has BM every other day.  2. STRAINING: Do you have to strain to have a BM?      yes 3. RECTAL PAIN: Does your rectum hurt when the stool comes out? If Yes, ask: Do you have hemorrhoids? How bad is the pain?  (Scale 1-10; or mild, moderate, severe)     Is not passing any stool  4. STOOL COMPOSITION: Are the stools hard?       Is not passing stools 5. BLOOD ON STOOLS: Has there been any blood on the toilet tissue or on the surface of the BM? If Yes, ask: When was the last time?     denies 6. CHRONIC CONSTIPATION: Is this a new problem for you?  If No, ask: How long have you had this problem? (days, weeks, months)      Denies chronic constipation issue 7. CHANGES IN DIET OR HYDRATION: Have there been any recent changes in your diet? How much fluids are you drinking on a daily basis?  How much have you had to drink today?     Increased intake of water 8. MEDICINES: Have you been taking any new medicines? Are you taking any narcotic pain medicines? (e.g., Dilaudid , morphine , Percocet, Vicodin)     Muscle relaxer and pain med 9. LAXATIVES: Have you been using any stool softeners, laxatives, or enemas?  If Yes, ask What, how often, and when was the last time?     Colace has been attempted 10. ACTIVITY:  How much walking do you do every day?  Has your activity level decreased in the past week?        Has not increased activity,  11. CAUSE: What do you think is causing the constipation?        Meds for hip 12. OTHER SYMPTOMS: Do you have any other symptoms? (e.g., abdomen pain, bloating, fever, vomiting)       denies  Protocols used: Constipation-A-AH

## 2023-09-02 ENCOUNTER — Ambulatory Visit: Payer: PPO | Admitting: *Deleted

## 2023-09-02 DIAGNOSIS — Z0000 Encounter for general adult medical examination without abnormal findings: Secondary | ICD-10-CM | POA: Diagnosis not present

## 2023-09-02 NOTE — Patient Instructions (Signed)
James Henson , Thank you for taking time to come for your Medicare Wellness Visit. I appreciate your ongoing commitment to your health goals. Please review the following plan we discussed and let me know if I can assist you in the future.     This is a list of the screening recommended for you and due dates:  Health Maintenance  Topic Date Due   Complete foot exam   Never done   Hepatitis C Screening  Never done   DTaP/Tdap/Td vaccine (3 - Td or Tdap) 01/28/2020   Zoster (Shingles) Vaccine (2 of 2) 10/22/2022   Eye exam for diabetics  11/19/2022   COVID-19 Vaccine (4 - 2024-25 season) 04/18/2023   Flu Shot  11/15/2023*   Yearly kidney health urinalysis for diabetes  01/01/2024   Hemoglobin A1C  02/14/2024   Yearly kidney function blood test for diabetes  08/15/2024   Medicare Annual Wellness Visit  09/01/2024   Colon Cancer Screening  08/07/2027   Pneumonia Vaccine  Completed   HPV Vaccine  Aged Out  *Topic was postponed. The date shown is not the original due date.    Next appointment: Follow up in one year for your annual wellness visit.   Preventive Care 21 Years and Older, Male Preventive care refers to lifestyle choices and visits with your health care provider that can promote health and wellness. What does preventive care include? A yearly physical exam. This is also called an annual well check. Dental exams once or twice a year. Routine eye exams. Ask your health care provider how often you should have your eyes checked. Personal lifestyle choices, including: Daily care of your teeth and gums. Regular physical activity. Eating a healthy diet. Avoiding tobacco and drug use. Limiting alcohol use. Practicing safe sex. Taking low doses of aspirin every day. Taking vitamin and mineral supplements as recommended by your health care provider. What happens during an annual well check? The services and screenings done by your health care provider during your annual well  check will depend on your age, overall health, lifestyle risk factors, and family history of disease. Counseling  Your health care provider may ask you questions about your: Alcohol use. Tobacco use. Drug use. Emotional well-being. Home and relationship well-being. Sexual activity. Eating habits. History of falls. Memory and ability to understand (cognition). Work and work Astronomer. Screening  You may have the following tests or measurements: Height, weight, and BMI. Blood pressure. Lipid and cholesterol levels. These may be checked every 5 years, or more frequently if you are over 10 years old. Skin check. Lung cancer screening. You may have this screening every year starting at age 67 if you have a 30-pack-year history of smoking and currently smoke or have quit within the past 15 years. Fecal occult blood test (FOBT) of the stool. You may have this test every year starting at age 57. Flexible sigmoidoscopy or colonoscopy. You may have a sigmoidoscopy every 5 years or a colonoscopy every 10 years starting at age 75. Prostate cancer screening. Recommendations will vary depending on your family history and other risks. Hepatitis C blood test. Hepatitis B blood test. Sexually transmitted disease (STD) testing. Diabetes screening. This is done by checking your blood sugar (glucose) after you have not eaten for a while (fasting). You may have this done every 1-3 years. Abdominal aortic aneurysm (AAA) screening. You may need this if you are a current or former smoker. Osteoporosis. You may be screened starting at age 46 if you  are at high risk. Talk with your health care provider about your test results, treatment options, and if necessary, the need for more tests. Vaccines  Your health care provider may recommend certain vaccines, such as: Influenza vaccine. This is recommended every year. Tetanus, diphtheria, and acellular pertussis (Tdap, Td) vaccine. You may need a Td booster  every 10 years. Zoster vaccine. You may need this after age 59. Pneumococcal 13-valent conjugate (PCV13) vaccine. One dose is recommended after age 87. Pneumococcal polysaccharide (PPSV23) vaccine. One dose is recommended after age 11. Talk to your health care provider about which screenings and vaccines you need and how often you need them. This information is not intended to replace advice given to you by your health care provider. Make sure you discuss any questions you have with your health care provider. Document Released: 08/30/2015 Document Revised: 04/22/2016 Document Reviewed: 06/04/2015 Elsevier Interactive Patient Education  2017 [employer].  Fall Prevention in the Home Falls can cause injuries. They can happen to people of all ages. There are many things you can do to make your home safe and to help prevent falls. What can I do on the outside of my home? Regularly fix the edges of walkways and driveways and fix any cracks. Remove anything that might make you trip as you walk through a door, such as a raised step or threshold. Trim any bushes or trees on the path to your home. Use bright outdoor lighting. Clear any walking paths of anything that might make someone trip, such as rocks or tools. Regularly check to see if handrails are loose or broken. Make sure that both sides of any steps have handrails. Any raised decks and porches should have guardrails on the edges. Have any leaves, snow, or ice cleared regularly. Use sand or salt on walking paths during winter. Clean up any spills in your garage right away. This includes oil or grease spills. What can I do in the bathroom? Use night lights. Install grab bars by the toilet and in the tub and shower. Do not use towel bars as grab bars. Use non-skid mats or decals in the tub or shower. If you need to sit down in the shower, use a plastic, non-slip stool. Keep the floor dry. Clean up any water that spills on the floor as soon  as it happens. Remove soap buildup in the tub or shower regularly. Attach bath mats securely with double-sided non-slip rug tape. Do not have throw rugs and other things on the floor that can make you trip. What can I do in the bedroom? Use night lights. Make sure that you have a light by your bed that is easy to reach. Do not use any sheets or blankets that are too big for your bed. They should not hang down onto the floor. Have a firm chair that has side arms. You can use this for support while you get dressed. Do not have throw rugs and other things on the floor that can make you trip. What can I do in the kitchen? Clean up any spills right away. Avoid walking on wet floors. Keep items that you use a lot in easy-to-reach places. If you need to reach something above you, use a strong step stool that has a grab bar. Keep electrical cords out of the way. Do not use floor polish or wax that makes floors slippery. If you must use wax, use non-skid floor wax. Do not have throw rugs and other things on the  floor that can make you trip. What can I do with my stairs? Do not leave any items on the stairs. Make sure that there are handrails on both sides of the stairs and use them. Fix handrails that are broken or loose. Make sure that handrails are as long as the stairways. Check any carpeting to make sure that it is firmly attached to the stairs. Fix any carpet that is loose or worn. Avoid having throw rugs at the top or bottom of the stairs. If you do have throw rugs, attach them to the floor with carpet tape. Make sure that you have a light switch at the top of the stairs and the bottom of the stairs. If you do not have them, ask someone to add them for you. What else can I do to help prevent falls? Wear shoes that: Do not have high heels. Have rubber bottoms. Are comfortable and fit you well. Are closed at the toe. Do not wear sandals. If you use a stepladder: Make sure that it is fully  opened. Do not climb a closed stepladder. Make sure that both sides of the stepladder are locked into place. Ask someone to hold it for you, if possible. Clearly mark and make sure that you can see: Any grab bars or handrails. First and last steps. Where the edge of each step is. Use tools that help you move around (mobility aids) if they are needed. These include: Canes. Walkers. Scooters. Crutches. Turn on the lights when you go into a dark area. Replace any light bulbs as soon as they burn out. Set up your furniture so you have a clear path. Avoid moving your furniture around. If any of your floors are uneven, fix them. If there are any pets around you, be aware of where they are. Review your medicines with your doctor. Some medicines can make you feel dizzy. This can increase your chance of falling. Ask your doctor what other things that you can do to help prevent falls. This information is not intended to replace advice given to you by your health care provider. Make sure you discuss any questions you have with your health care provider. Document Released: 05/30/2009 Document Revised: 01/09/2016 Document Reviewed: 09/07/2014 Elsevier Interactive Patient Education  2017 [employer].
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
## 2023-09-05 ENCOUNTER — Other Ambulatory Visit: Payer: Self-pay | Admitting: Internal Medicine

## 2023-09-29 ENCOUNTER — Ambulatory Visit: Payer: PPO | Admitting: Allergy

## 2023-10-05 ENCOUNTER — Other Ambulatory Visit: Payer: Self-pay | Admitting: Internal Medicine

## 2023-10-05 MED ORDER — NYSTATIN 100000 UNIT/ML MT SUSP: 5.0000 mL | Freq: Four times a day (QID) | OROMUCOSAL | 0 refills | Status: DC

## 2023-10-05 NOTE — Telephone Encounter (Signed)
 Rx sent

## 2023-10-05 NOTE — Telephone Encounter (Signed)
Copied from CRM [id number]. Topic: Clinical - Medication Refill >> Oct 05, 2023  1:11 PM Aletta Edouard wrote: Most Recent Primary Care Visit:  Provider: Juel Burrow  Department: [facility]  Visit Type: MEDICARE AWV, INITIAL  Date: 09/02/2023  Medication: nystatin (MYCOSTATIN)  Has the patient contacted their pharmacy? Yes (Agent: If no, request that the patient contact the pharmacy for the refill. If patient does not wish to contact the pharmacy document the reason why and proceed with request.) (Agent: If yes, when and what did the pharmacy advise?)  Is this the correct pharmacy for this prescription? Yes If no, delete pharmacy and type the correct one.  This is the patient's preferred pharmacy:  [facility] #[id number] [place], [place] - [address] AT [facility] OF [facility] & [address] [place] [address] Phone: [phone] Fax: [phone]   Has the prescription been filled recently? No  Is the patient out of the medication? Yes  Has the patient been seen for an appointment in the last year OR does the patient have an upcoming appointment? Yes  Can we respond through MyChart? No  Agent: Please be advised that Rx refills may take up to 3 business days. We ask that you follow-up with your pharmacy.

## 2023-10-06 ENCOUNTER — Other Ambulatory Visit: Payer: Self-pay | Admitting: Internal Medicine

## 2023-10-07 ENCOUNTER — Telehealth: Payer: Self-pay | Admitting: Internal Medicine

## 2023-10-07 NOTE — Telephone Encounter (Unsigned)
Copied from CRM [id number]. Topic: Clinical - Medication Refill >> Oct 07, 2023  3:56 PM [place] wrote: Most Recent Primary Care Visit:  Provider: Juel Burrow  Department: [facility]  Visit Type: MEDICARE AWV, INITIAL  Date: 09/02/2023  Medication: gabapentin (NEURONTIN) 600 MG tablet pregabalin (LYRICA) 150 MG capsule tolterodine (DETROL LA) 4 MG 24 hr capsule  Has the patient contacted their pharmacy? Yes (Agent: If no, request that the patient contact the pharmacy for the refill. If patient does not wish to contact the pharmacy document the reason why and proceed with request.) (Agent: If yes, when and what did the pharmacy advise?)  Is this the correct pharmacy for this prescription? Yes If no, delete pharmacy and type the correct one.  This is the patient's preferred pharmacy:  [facility] #[id number] [place], [place] - [address] AT [facility] OF [facility] & [address] [place] [address] Phone: [phone] Fax: [phone]   Has the prescription been filled recently? Yes  Is the patient out of the medication? Yes  Has the patient been seen for an appointment in the last year OR does the patient have an upcoming appointment? Yes  Can we respond through MyChart? Yes  Agent: Please be advised that Rx refills may take up to 3 business days. We ask that you follow-up with your pharmacy.

## 2023-10-08 ENCOUNTER — Telehealth: Payer: Self-pay | Admitting: *Deleted

## 2023-10-08 MED ORDER — TOLTERODINE TARTRATE ER 4 MG PO CP24: 4.0000 mg | ORAL_CAPSULE | Freq: Every day | ORAL | 1 refills | Status: DC

## 2023-10-08 MED ORDER — PREGABALIN 150 MG PO CAPS: 150.0000 mg | ORAL_CAPSULE | Freq: Two times a day (BID) | ORAL | 0 refills | Status: DC

## 2023-10-08 MED ORDER — GABAPENTIN 600 MG PO TABS: 600.0000 mg | ORAL_TABLET | Freq: Four times a day (QID) | ORAL | 1 refills | Status: DC

## 2023-10-08 NOTE — Telephone Encounter (Signed)
Requesting: Lyrica 150mg   Contract: None, will get at next OV UDS: None, will get UDS at next OV  Last Visit: 08/16/23 Next Visit: 11/16/23 Last Refill: 04/12/23 #180 and 0RF   Pt on gabapentin and Lyrica, he also has gabapentin on his allergy/intolerance list- okay to remove it?    Please Advise

## 2023-10-08 NOTE — Telephone Encounter (Signed)
 PDMP okay, Rx sent

## 2023-10-08 NOTE — Telephone Encounter (Signed)
Received and w/ PCP.

## 2023-10-08 NOTE — Telephone Encounter (Signed)
Copied from CRM [id number]. Topic: Clinical - Medication Question >> Oct 08, 2023  3:37 PM James Henson wrote: Reason for CRM: Patient called in asking about the status of medication pregabalin (LYRICA) 150 MG capsule being sent into pharmacy for him.He said that he is out and it is urgent that he has it filled before the weekend.He said that he can't go without this medication

## 2023-10-11 ENCOUNTER — Telehealth: Payer: Self-pay

## 2023-10-11 NOTE — Telephone Encounter (Signed)
 Refer to other phone note   Copied from CRM [id number]. Topic: General - Other >> Oct 11, 2023 10:18 AM [place] A wrote: Reason for CRM: Patient would like to speak with Primary's Nurse-please call back

## 2023-10-11 NOTE — Telephone Encounter (Signed)
 Pt called and lvm to return call , refill was sent to pharmacy on 10/08/2023 by PCP   Copied from CRM [id number]. Topic: Clinical - Prescription Issue >> Oct 11, 2023 10:06 AM Gurney Maxin H wrote: Reason for CRM: Patient is calling following up on his refill for pregabalin (LYRICA) 150 MG capsule, medication shows discontinued by provider. Patient states if he doesn't get a callback he will be showing up at the office.

## 2023-10-22 ENCOUNTER — Other Ambulatory Visit: Payer: Self-pay | Admitting: Allergy

## 2023-10-25 ENCOUNTER — Other Ambulatory Visit: Payer: Self-pay | Admitting: Allergy

## 2023-10-25 NOTE — Telephone Encounter (Signed)
 Duplicate orders

## 2023-11-03 ENCOUNTER — Ambulatory Visit: Admitting: Allergy

## 2023-11-03 ENCOUNTER — Encounter: Payer: Self-pay | Admitting: Allergy

## 2023-11-03 ENCOUNTER — Other Ambulatory Visit: Payer: Self-pay

## 2023-11-03 VITALS — BP 138/80 | HR 71 | Temp 98.8°F | Resp 16 | Ht 68.5 in | Wt 285.3 lb

## 2023-11-03 DIAGNOSIS — J4540 Moderate persistent asthma, uncomplicated: Secondary | ICD-10-CM

## 2023-11-03 DIAGNOSIS — K219 Gastro-esophageal reflux disease without esophagitis: Secondary | ICD-10-CM

## 2023-11-03 DIAGNOSIS — J310 Chronic rhinitis: Secondary | ICD-10-CM | POA: Diagnosis not present

## 2023-11-03 MED ORDER — CARBINOXAMINE MALEATE 6 MG PO TABS: 1.0000 | ORAL_TABLET | Freq: Two times a day (BID) | ORAL | 3 refills | Status: DC

## 2023-11-03 MED ORDER — AZELASTINE HCL 0.1 % NA SOLN: 2.0000 | Freq: Two times a day (BID) | NASAL | 5 refills | Status: DC | PRN

## 2023-11-03 NOTE — Patient Instructions (Addendum)
 Rhinitis - use Azelastine 2 sprays each nostril twice a day as needed for runny nose control.   - use Qnasl 1 spray twice a day for nasal congestion control.  Use for 1-2 weeks at a time before stopping once congestion improves.  - continue Ryvent (carbinaxomine) 6mg  1 tab twice a day at this time.  - continue Montelukast 10mg  daily. - if symptoms not improving with above regimen then would recommend skin testing to environmental allergens.  Will need to hold antihistamine for at least 3 days prior to any skin testing visit  Reflux - continue Famotidine and Esomeprazole for reflux control  Asthma - continue Breo 1 puff daily.   - have access to albuterol inhaler 2 puffs OR albuterol 1 vial via nebulier every 4 hours as needed for cough/wheeze/shortness of breath/chest tightness.  May use 15-20 minutes prior to activity.   Monitor frequency of use.    Control goals:  Full participation in all desired activities (may need albuterol before activity) Albuterol use two time or less a week on average (not counting use with activity) Cough interfering with sleep two time or less a month Oral steroids no more than once a year No hospitalizations  Return in about 6 months or sooner if needed

## 2023-11-03 NOTE — Progress Notes (Signed)
 Follow-up Note  RE: James Petersen Clavijo Sr. MRN: [id number] DOB: Mar 31, 1951 Date of Office Visit: 11/03/2023   History of present illness: James Bivins Sr. is a 73 y.o. male presenting today for follow-up of asthma, rhinitis, reflux and was last seen on 03/31/23 by myself.   He has increased allergy symptoms after running out of Ryvent, experiencing 'polleny stuff' and nasal drainage primarily in his nose. Ryvent is effective in managing these symptoms, and he notices a significant difference when not on the medication.  He has been out about 2 weeks now.   He continues to use montelukast (Singulair) and finds it effective as well. He also uses nasal sprays currently Qnasl, which remain effective. Although he has not been using Azelastine recently, he would like a refill to have for use during the pollen season if needed.  Regarding his respiratory status, his breathing is fine, and he has not needed to use his rescue inhaler. He used his nebulizer a few days after his last visit, which he finds helpful when needed. He continues to use Breo inhaler 1 puff once daily. No current breathing difficulties.  Review of systems: 10pt ROS negative unless noted above in HPI   Past medical/social/surgical/family history have been reviewed and are unchanged unless specifically indicated below.  No changes  Medication List: Current Outpatient Medications  Medication Sig Dispense Refill   albuterol (VENTOLIN HFA) 108 (90 Base) MCG/ACT inhaler Inhale 2 puffs into the lungs every 6 (six) hours as needed for wheezing or shortness of breath. 18 g 1   atorvastatin (LIPITOR) 40 MG tablet TK 1 T PO  D  3   azelastine (ASTELIN) 0.1 % nasal spray Place 2 sprays into both nostrils 2 (two) times daily as needed (nasal drainage). Use in each nostril as directed 30 mL 5   Beclomethasone Dipropionate (QNASL) 80 MCG/ACT AERS Place 1 spray into both nostrils 2 (two) times daily. 31.8 g 1   BREO  ELLIPTA 200-25 MCG/ACT AEPB Inhale 1 puff into the lungs daily. 28 each 5   Continuous Glucose Receiver (FREESTYLE LIBRE 3 READER) DEVI 1 each by Does not apply route daily. Use with sensors to check blood glucose continuously 1 each 0   Continuous Glucose Sensor (FREESTYLE LIBRE 3 SENSOR) MISC Place 1 sensor on the skin every 14 days. Use to check glucose continuously 2 each 2   esomeprazole (NEXIUM) 40 MG capsule Take 1 capsule (40 mg total) by mouth daily before breakfast. 90 capsule 1   famotidine (PEPCID) 20 MG tablet Take 1 tablet (20 mg total) by mouth daily. 90 tablet 1   gabapentin (NEURONTIN) 600 MG tablet Take 1 tablet (600 mg total) by mouth 4 (four) times daily. 360 tablet 1   levothyroxine (SYNTHROID) 175 MCG tablet Take 1 tablet (175 mcg total) by mouth daily before breakfast. 90 tablet 1   losartan (COZAAR) 100 MG tablet Take 1 tablet (100 mg total) by mouth daily. 90 tablet 1   magic mouthwash (lidocaine, diphenhydrAMINE, alum & mag hydroxide) suspension Swish and swallow 5 mLs 3 (three) times daily as needed for mouth pain. 360 mL 0   meloxicam (MOBIC) 15 MG tablet TAKE 1 TABLET(15 MG) BY MOUTH DAILY AS NEEDED FOR PAIN 30 tablet 0   metFORMIN (GLUCOPHAGE) 1000 MG tablet Take 1 tablet (1,000 mg total) by mouth 2 (two) times daily with a meal. 180 tablet 1   montelukast (SINGULAIR) 10 MG tablet TAKE 1 TABLET(10 MG) BY MOUTH AT BEDTIME  90 tablet 2   nystatin (MYCOSTATIN) 100000 UNIT/ML suspension Take 5 mLs (500,000 Units total) by mouth 4 (four) times daily. 60 mL 0   pregabalin (LYRICA) 150 MG capsule Take 1 capsule (150 mg total) by mouth 2 (two) times daily. 180 capsule 0   Semaglutide, 1 MG/DOSE, 4 MG/3ML SOPN Inject 1 mg into the skin once a week. 3 mL 3   tolterodine (DETROL LA) 4 MG 24 hr capsule Take 1 capsule (4 mg total) by mouth daily. 90 capsule 1   Carbinoxamine Maleate (RYVENT) 6 MG TABS Take 1 tablet (6 mg total) by mouth 2 (two) times daily. 120 tablet 3   No current  facility-administered medications for this visit.     Known medication allergies: Allergies  Allergen Reactions   Gabapentin Other (See Comments)    Fuzzy headed w/ high doses   Lisinopril Cough   Solifenacin Other (See Comments)    dizziness   Myrbetriq [Mirabegron] Rash     Physical examination: Blood pressure 138/80, pulse 71, temperature 98.8 F (37.1 C), temperature source Temporal, resp. rate 16, height 5' 8.5" (1.74 m), weight 285 lb 4.8 oz (129.4 kg), SpO2 94%.  General: Alert, interactive, in no acute distress. HEENT: PERRLA, TMs pearly gray, turbinates minimally edematous without discharge, post-pharynx non erythematous. Neck: Supple without lymphadenopathy. Lungs: Clear to auscultation without wheezing, rhonchi or rales. {no increased work of breathing. CV: Normal S1, S2 without murmurs. Abdomen: Nondistended, nontender. Skin: Warm and dry, without lesions or rashes. Extremities:  No clubbing, cyanosis or edema. Neuro:   Grossly intact.  Diagnositics/Labs:  Spirometry: FEV1: 2.49L 76%, FVC: 3.56L 81%, ratio consistent with nonobstructive pattern   Assessment and plan:   Rhinitis - use Azelastine 2 sprays each nostril twice a day as needed for runny nose control.   - use Qnasl 1 spray twice a day for nasal congestion control.  Use for 1-2 weeks at a time before stopping once congestion improves.  - continue Ryvent (carbinaxomine) 6mg  1 tab twice a day at this time.  - continue Montelukast 10mg  daily. - if symptoms not improving with above regimen then would recommend skin testing to environmental allergens.  Will need to hold antihistamine for at least 3 days prior to any skin testing visit  Reflux - continue Famotidine and Esomeprazole for reflux control  Asthma - continue Breo 1 puff daily.   - have access to albuterol inhaler 2 puffs OR albuterol 1 vial via nebulier every 4 hours as needed for cough/wheeze/shortness of breath/chest tightness.  May  use 15-20 minutes prior to activity.   Monitor frequency of use.    Control goals:  Full participation in all desired activities (may need albuterol before activity) Albuterol use two time or less a week on average (not counting use with activity) Cough interfering with sleep two time or less a month Oral steroids no more than once a year No hospitalizations  Return in about 6 months or sooner if needed  I appreciate the opportunity to take part in [place]'s care. Please do not hesitate to contact me with questions.  Sincerely,   Margo Aye, MD Allergy/Immunology [facility]

## 2023-11-04 ENCOUNTER — Other Ambulatory Visit: Payer: Self-pay | Admitting: Allergy

## 2023-11-04 ENCOUNTER — Other Ambulatory Visit (HOSPITAL_COMMUNITY): Payer: Self-pay

## 2023-11-04 ENCOUNTER — Telehealth: Payer: Self-pay

## 2023-11-04 ENCOUNTER — Telehealth: Payer: Self-pay | Admitting: Allergy

## 2023-11-04 NOTE — Telephone Encounter (Signed)
 Patient called and stated the pharmacy was unable to fill his Ryvent (carbinaxomine) because his insuarance will not cover it. He's asking if Dr.Padgett can fight for the Ryvent (carbinaxomine) with the insurance so he will be able to take it.  Please Advise  Best Contact: [phone]

## 2023-11-04 NOTE — Telephone Encounter (Signed)
 I spoke to the pharmacist and she stated that they do not carry the Carbinoxamine Maleate (RYVENT) 6MG  tablets and only can fill Carbinoxamine Maleate (RYVENT) 4MG  tablets.  The medication was denied by the pharmacy for that reason. Please advise.

## 2023-11-04 NOTE — Telephone Encounter (Signed)
 Patient called stating he just received a call from [place]. I read the note from the Prior Authorization team. Patient would like a call back.

## 2023-11-04 NOTE — Telephone Encounter (Signed)
 PA request has been Submitted. New Encounter has been or will be created for follow up. For additional info see Pharmacy Prior Auth telephone encounter from 03/20.

## 2023-11-04 NOTE — Telephone Encounter (Signed)
 Pharmacy Patient Advocate Encounter   Received notification from Pt Calls Messages that prior authorization for RyVent 6MG  tablets  is required/requested.   Insurance verification completed.   The patient is insured through [facility] ADVANTAGE/RX ADVANCE .   Per test claim: The current 90 day co-pay is, $153.32.  No PA needed at this time. This test claim was processed through [facility]- copay amounts may vary at other pharmacies due to pharmacy/plan contracts, or as the patient moves through the different stages of their insurance plan.      *Brand name Ryvent preferred. Pharmacy must process with a DAW 9

## 2023-11-04 NOTE — Telephone Encounter (Signed)
 I called the patient to update him on the medication cost. I left a message for him to call the [place] office.

## 2023-11-04 NOTE — Telephone Encounter (Signed)
 Spoke with the patient and he stated his insurance denied his refills. I am calling the pharmacy and I am currently still on hold.  Patient will be notified one medication status once the pharmacy answers the phone.

## 2023-11-05 ENCOUNTER — Encounter: Payer: Self-pay | Admitting: Allergy

## 2023-11-05 MED ORDER — CARBINOXAMINE MALEATE 4 MG PO TABS: 1.0000 | ORAL_TABLET | Freq: Two times a day (BID) | ORAL | 5 refills | Status: DC | PRN

## 2023-11-05 MED ORDER — CARBINOXAMINE MALEATE 4 MG PO TABS
1.0000 | ORAL_TABLET | Freq: Every day | ORAL | 0 refills | Status: DC
Start: 2023-11-05 — End: 2023-11-05

## 2023-11-05 NOTE — Telephone Encounter (Signed)
 Pt states he still does not have his medication, he called the pharmacy this morning and says a prescription for the 4 mg needs to be sent to the pharmacy ASAP.

## 2023-11-05 NOTE — Telephone Encounter (Signed)
 James Henson came into the office and needs Ryvent 4mg  tablets sent in to his pharmacy.  James Henson is upset and a little belligerent.  Per Waynetta Sandy, can we please escalate this request.

## 2023-11-05 NOTE — Addendum Note (Signed)
 Addended by: Floydene Flock C on: 11/05/2023 04:23 PM   Modules accepted: Orders

## 2023-11-07 ENCOUNTER — Other Ambulatory Visit: Payer: Self-pay | Admitting: Internal Medicine

## 2023-11-08 ENCOUNTER — Telehealth: Payer: Self-pay

## 2023-11-08 NOTE — Telephone Encounter (Signed)
 Called patient to check on his medication and see if he was able to pick it up from the pharmacy. Mr. James Henson advised he did receive the Rx but only received 12 tablets. He is going to follow up with the pharmacy to try and sort out the reasoning. Mr. James Henson also stated he was going to call us because he now has a sinus infection and is requesting a Z-pack be sent in for him. I advised I would send a message to Dr. Delorse Lek for further instructions.

## 2023-11-09 MED ORDER — AMOXICILLIN-POT CLAVULANATE 875-125 MG PO TABS: 1.0000 | ORAL_TABLET | Freq: Two times a day (BID) | ORAL | 0 refills | Status: AC

## 2023-11-09 NOTE — Telephone Encounter (Signed)
 Mr. Kading is adamant he has a sinus infection as he has had many of them. He is having excessive mucous build up, nasal congestion, headache, sinus pressure, post nasal drainage making him cough and low grade fever. Using Qnasal. He states he needs the "stronger" medication as azithromycin never works for him. He is stating this happened because he was without his Ryvent for 4 days.

## 2023-11-09 NOTE — Addendum Note (Signed)
 Addended by: Floydene Flock C on: 11/09/2023 05:04 PM   Modules accepted: Orders

## 2023-11-09 NOTE — Telephone Encounter (Signed)
 Tried to call patient back, no answer, mailbox full, will send mychart message.

## 2023-11-09 NOTE — Telephone Encounter (Signed)
 Patient called and is asking for an update and call back from the nurses today if possible.  Best Contact: [phone]

## 2023-11-16 ENCOUNTER — Ambulatory Visit: Payer: PPO | Admitting: Internal Medicine

## 2023-11-19 ENCOUNTER — Other Ambulatory Visit: Payer: Self-pay | Admitting: Internal Medicine

## 2023-11-27 ENCOUNTER — Other Ambulatory Visit: Payer: Self-pay | Admitting: Allergy

## 2023-12-02 ENCOUNTER — Other Ambulatory Visit: Payer: Self-pay | Admitting: Internal Medicine

## 2023-12-02 ENCOUNTER — Encounter: Payer: Self-pay | Admitting: Internal Medicine

## 2023-12-02 NOTE — Telephone Encounter (Signed)
 Copied from CRM [id number]. Topic: Clinical - Medication Refill >> Dec 02, 2023  3:03 PM Dimple Francis wrote: Most Recent Primary Care Visit:  Provider: Gabe Jock  Department: [facility]  Visit Type: MEDICARE AWV, INITIAL  Date: 09/02/2023  Medication: atorvastatin (LIPITOR) 40 MG tablet  Has the patient contacted their pharmacy? Yes (Agent: If no, request that the patient contact the pharmacy for the refill. If patient does not wish to contact the pharmacy document the reason why and proceed with request.) (Agent: If yes, when and what did the pharmacy advise?)  Is this the correct pharmacy for this prescription? Yes If no, delete pharmacy and type the correct one.  This is the patient's preferred pharmacy:  [facility] #[id number] [place], [place] - [address] AT [facility] OF [facility] & [address] [place] [address] Phone: [phone] Fax: [phone]   Has the prescription been filled recently? Yes  Is the patient out of the medication? Yes  Has the patient been seen for an appointment in the last year OR does the patient have an upcoming appointment? Yes  Can we respond through MyChart? Yes  Agent: Please be advised that Rx refills may take up to 3 business days. We ask that you follow-up with your pharmacy.

## 2023-12-05 ENCOUNTER — Other Ambulatory Visit: Payer: Self-pay | Admitting: Internal Medicine

## 2023-12-05 DIAGNOSIS — K219 Gastro-esophageal reflux disease without esophagitis: Secondary | ICD-10-CM

## 2023-12-06 MED ORDER — ATORVASTATIN CALCIUM 40 MG PO TABS: 40.0000 mg | ORAL_TABLET | Freq: Every day | ORAL | 1 refills | Status: DC

## 2023-12-07 ENCOUNTER — Other Ambulatory Visit: Payer: Self-pay | Admitting: Internal Medicine

## 2023-12-21 ENCOUNTER — Encounter: Payer: Self-pay | Admitting: Internal Medicine

## 2023-12-22 ENCOUNTER — Ambulatory Visit (INDEPENDENT_AMBULATORY_CARE_PROVIDER_SITE_OTHER): Admitting: Internal Medicine

## 2023-12-22 ENCOUNTER — Encounter: Payer: Self-pay | Admitting: Internal Medicine

## 2023-12-22 VITALS — BP 122/62 | HR 76 | Temp 98.2°F | Resp 18 | Ht 68.5 in | Wt 271.1 lb

## 2023-12-22 DIAGNOSIS — I10 Essential (primary) hypertension: Secondary | ICD-10-CM

## 2023-12-22 DIAGNOSIS — E079 Disorder of thyroid, unspecified: Secondary | ICD-10-CM

## 2023-12-22 DIAGNOSIS — Z1211 Encounter for screening for malignant neoplasm of colon: Secondary | ICD-10-CM

## 2023-12-22 DIAGNOSIS — Z7984 Long term (current) use of oral hypoglycemic drugs: Secondary | ICD-10-CM

## 2023-12-22 DIAGNOSIS — Z7985 Long-term (current) use of injectable non-insulin antidiabetic drugs: Secondary | ICD-10-CM

## 2023-12-22 DIAGNOSIS — E119 Type 2 diabetes mellitus without complications: Secondary | ICD-10-CM

## 2023-12-22 DIAGNOSIS — E66813 Obesity, class 3: Secondary | ICD-10-CM

## 2023-12-22 DIAGNOSIS — R399 Unspecified symptoms and signs involving the genitourinary system: Secondary | ICD-10-CM

## 2023-12-22 DIAGNOSIS — E559 Vitamin D deficiency, unspecified: Secondary | ICD-10-CM | POA: Diagnosis not present

## 2023-12-22 DIAGNOSIS — Z6841 Body Mass Index (BMI) 40.0 and over, adult: Secondary | ICD-10-CM

## 2023-12-22 LAB — COMPREHENSIVE METABOLIC PANEL WITH GFR
ALT: 16 U/L (ref 0–53)
AST: 17 U/L (ref 0–37)
Albumin: 4.4 g/dL (ref 3.5–5.2)
Alkaline Phosphatase: 64 U/L (ref 39–117)
BUN: 16 mg/dL (ref 6–23)
CO2: 27 meq/L (ref 19–32)
Calcium: 9 mg/dL (ref 8.4–10.5)
Chloride: 102 meq/L (ref 96–112)
Creatinine, Ser: 0.99 mg/dL (ref 0.40–1.50)
GFR: 75.15 mL/min (ref >60.00)
Glucose, Bld: 80 mg/dL (ref 70–99)
Potassium: 4.2 meq/L (ref 3.5–5.1)
Sodium: 138 meq/L (ref 135–145)
Total Bilirubin: 0.8 mg/dL (ref 0.2–1.2)
Total Protein: 6.8 g/dL (ref 6.0–8.3)

## 2023-12-22 LAB — CBC WITH DIFFERENTIAL/PLATELET
Basophils Absolute: 0.1 10*3/uL (ref 0.0–0.1)
Basophils Relative: 0.9 % (ref 0.0–3.0)
Eosinophils Absolute: 0.3 10*3/uL (ref 0.0–0.7)
Eosinophils Relative: 3.9 % (ref 0.0–5.0)
HCT: 46.6 % (ref 39.0–52.0)
Hemoglobin: 15.5 g/dL (ref 13.0–17.0)
Lymphocytes Relative: 22.3 % (ref 12.0–46.0)
Lymphs Abs: 1.5 10*3/uL (ref 0.7–4.0)
MCHC: 33.2 g/dL (ref 30.0–36.0)
MCV: 89.7 fl (ref 78.0–100.0)
Monocytes Absolute: 0.4 10*3/uL (ref 0.1–1.0)
Monocytes Relative: 6.4 % (ref 3.0–12.0)
Neutro Abs: 4.3 10*3/uL (ref 1.4–7.7)
Neutrophils Relative %: 66.5 % (ref 43.0–77.0)
Platelets: 241 10*3/uL (ref 150.0–400.0)
RBC: 5.2 Mil/uL (ref 4.22–5.81)
RDW: 14.2 % (ref 11.5–15.5)
WBC: 6.5 10*3/uL (ref 4.0–10.5)

## 2023-12-22 LAB — TSH: TSH: 0.38 u[IU]/mL (ref 0.35–5.50)

## 2023-12-22 LAB — PSA: PSA: 6.47 ng/mL — ABNORMAL HIGH (ref 0.10–4.00)

## 2023-12-22 LAB — HEMOGLOBIN A1C: Hgb A1c MFr Bld: 6 % (ref 4.6–6.5)

## 2023-12-22 LAB — VITAMIN D 25 HYDROXY (VIT D DEFICIENCY, FRACTURES): VITD: 39.74 ng/mL (ref 30.00–100.00)

## 2023-12-22 NOTE — Patient Instructions (Addendum)
 INSTRUCTIONS  FOR TODAY   Check the  blood pressure regularly Blood pressure goal:  between 110/65 and  135/85. If it is consistently higher or lower, let me know  Check your blood sugars  GO TO THE LAB : Get the blood work     Next office visit for a physical exam in 4 weeks Please make an appointment before you leave today Depending on your blood or XRs results it might be necessary to come back sooner     Per our records you are due for your diabetic eye exam. Please contact your eye doctor to schedule an appointment. Please have them send copies of your office visit notes to us . Our fax number is [phone]. If you need a referral to an eye doctor please let us  know.
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
## 2023-12-23 LAB — URINALYSIS, ROUTINE W REFLEX MICROSCOPIC
Bilirubin Urine: NEGATIVE
Hgb urine dipstick: NEGATIVE
Ketones, ur: NEGATIVE
Leukocytes,Ua: NEGATIVE
Nitrite: NEGATIVE
Specific Gravity, Urine: 1.02 (ref 1.000–1.030)
Total Protein, Urine: NEGATIVE
Urine Glucose: NEGATIVE
Urobilinogen, UA: 4 — AB (ref 0.0–1.0)
pH: 6 (ref 5.0–8.0)

## 2023-12-23 LAB — URINE CULTURE
MICRO NUMBER:: 16424995
Result:: NO GROWTH
SPECIMEN QUALITY:: ADEQUATE

## 2023-12-23 NOTE — Assessment & Plan Note (Signed)
 DM: In January, the patient requested to switch rybelsus  to  Ozempic  in order to lose weight. Initial weight 302 pounds, currently 271, has changed his diet completely.  Takes  frequent walks. Plan: Continue Ozempic , metformin , checking labs. Fatigue: Despite wt loss, doesn't feel  more energetic.   Review of system is negative, good CPAP compliance with a new machine. CPAP few weeks ago with good compliance.  Plan: Will check labs today, it is likely that as he continue with his healthy lifestyle she will get more energetic Morbid obesity: See above. HTN: On losartan , checking a CMP and CBC.  BP today is okay, does not seem to be overcontrolled. Hypothyroidism: On Synthroid , check TSH Vitamin D  deficiency, history of, check levels. BPH: Chronic nocturia, on Detrol , history of prostate biopsy remotely.  Plan: PSA UA urine culture.  Consider Flomax . Neuropathy: On gabapentin  and Lyrica , at some point last month took extra gabapentin , discouraged to do so.  Again offered neurology visit since symptoms are not completely well-controlled -  Declined . CCS: Virtual colonoscopy 08/06/2017: Poor quality. Sigmoidoscopy 09/16/2017: Large polyp, pathology-tubular adenoma, excised, next endoscopy 3-year Previous referral to GI failed, will try again, patient agrees. RTC 1 month for CPX discussed results

## 2023-12-28 ENCOUNTER — Ambulatory Visit: Payer: Self-pay | Admitting: Internal Medicine

## 2023-12-28 MED ORDER — TAMSULOSIN HCL 0.4 MG PO CAPS: 0.4000 mg | ORAL_CAPSULE | Freq: Every day | ORAL | 1 refills | Status: DC

## 2023-12-28 NOTE — Addendum Note (Signed)
 Addended by: Georgann Bramble D on: 12/28/2023 04:12 PM   Modules accepted: Orders

## 2023-12-31 ENCOUNTER — Other Ambulatory Visit: Payer: Self-pay | Admitting: Allergy

## 2024-01-01 ENCOUNTER — Other Ambulatory Visit: Payer: Self-pay | Admitting: Allergy

## 2024-01-04 ENCOUNTER — Encounter: Payer: Self-pay | Admitting: Internal Medicine

## 2024-01-08 ENCOUNTER — Telehealth: Payer: Self-pay | Admitting: Internal Medicine

## 2024-01-11 NOTE — Telephone Encounter (Signed)
 PDMP okay, Rx sent

## 2024-01-11 NOTE — Telephone Encounter (Signed)
 Requesting: Lyrica  150mg   Contract: None UDS: None Last Visit: 12/22/23 Next Visit: 02/15/24 Last Refill: 10/08/23 #180 and 0RF   Please Advise

## 2024-01-14 ENCOUNTER — Other Ambulatory Visit: Payer: Self-pay | Admitting: Internal Medicine

## 2024-01-14 ENCOUNTER — Telehealth: Payer: Self-pay | Admitting: Internal Medicine

## 2024-01-14 NOTE — Telephone Encounter (Signed)
 Copied from CRM [id number]. Topic: Clinical - Medication Refill >> Jan 14, 2024  2:26 PM Savanna F wrote: Medication: Semaglutide , 1 MG/DOSE, (OZEMPIC , 1 MG/DOSE,) 4 MG/3ML SOPN  (Pt states he is getting frustrated having to call to refill medications so often and requests that we please do more quantity of the medications so that he does not have to call to get refills once a month). Wanted the PCP to know this.   Has the patient contacted their pharmacy? Yes (Agent: If no, request that the patient contact the pharmacy for the refill. If patient does not wish to contact the pharmacy document the reason why and proceed with request.) (Agent: If yes, when and what did the pharmacy advise?)  This is the patient's preferred pharmacy:  [id number] Jonette Nestle, [place] - [address] AT [facility] OF [facility] [address] [place] [address] Phone: [phone] Fax: [phone]  Is this the correct pharmacy for this prescription? Yes If no, delete pharmacy and type the correct one.   Has the prescription been filled recently? No  Is the patient out of the medication? Yes  Has the patient been seen for an appointment in the last year OR does the patient have an upcoming appointment? Yes  Can we respond through MyChart? No, please call [phone]  Agent: Please be advised that Rx refills may take up to 3 business days. We ask that you follow-up with your pharmacy.

## 2024-01-14 NOTE — Telephone Encounter (Signed)
 See prescription, was sent yesterday w/ 3 refills.

## 2024-02-01 ENCOUNTER — Other Ambulatory Visit: Payer: Self-pay | Admitting: Internal Medicine

## 2024-02-02 ENCOUNTER — Other Ambulatory Visit: Payer: Self-pay | Admitting: Internal Medicine

## 2024-02-14 ENCOUNTER — Telehealth: Payer: Self-pay

## 2024-02-14 NOTE — Telephone Encounter (Signed)
 Initial Comment Caller states last night and this morning he left a tremendous amount of blood on his bed. He says when he was cleaning it up there was a big clot of blood Translation No Nurse Assessment Nurse: Geradine, RN, Sanjuanita Janie Date/Time (Eastern Time): 02/13/2024 10:23:18 AM Confirm and document reason for call. If symptomatic, describe symptoms. ---Caller states he has had 'substantial' rectal bleeding w/a marble-sized blood clot & is sleepy d/t loss of sleep; first noted overnight. Denies fever now, recent injury/procedure, or any other symptoms. Does the patient have any new or worsening symptoms? ---Yes Will a triage be completed? ---Yes Related visit to physician within the last 2 weeks? ---N/A Does the PT have any chronic conditions? (i.e. diabetes, asthma, this includes High risk factors for pregnancy, etc.) ---Unknown Is this a behavioral health or substance abuse call? ---No Guidelines Guideline Title Affirmed Question Affirmed Notes Nurse Date/Time (Eastern Time) Rectal Bleeding SEVERE rectal bleeding (e.g., large blood clots; constant or on and off bleeding) Cazares, RN, Sanjuanita Janie 02/13/2024 10:19:21 AM Disp. Time Titus Time) Disposition Final User 02/13/2024 9:50:40 AM Attempt made - message left Cazares, RN, Sanjuanita Janie PLEASE NOTE: All timestamps contained within this report are represented as [place] Standard Time. CONFIDENTIALTY NOTICE: This fax transmission is intended only for the addressee. It contains information that is legally privileged, confidential or otherwise protected from use or disclosure. If you are not the intended recipient, you are strictly prohibited from reviewing, disclosing, copying using or disseminating any of this information or taking any action in reliance on or regarding this information. If you have received this fax in error, please notify us  immediately by telephone so that we can arrange for its return  to us . Phone: [phone], Toll-Free: [phone], Fax: [phone] [facility] [birth year] Page: 1 of2 CallId: [id number] Disp. Time Titus Time) Disposition Final User 02/13/2024 9:51:56 AM Send To RN Personal Geradine, RN, Sanjuanita Janie 02/13/2024 10:09:12 AM Attempt made - message left Cazares, RN, Sanjuanita Janie 02/13/2024 10:21:30 AM Go to ED Now Yes Geradine, RN, Sanjuanita Janie Final Disposition 02/13/2024 10:21:30 AM Go to ED Now Yes Geradine, RN, Sanjuanita Janie Caller Disagree/Comply Disagree Caller Understands Yes PreDisposition InappropriateToAsk Care Advice Given Per Guideline NOTE TO TRIAGER - DRIVING: * Another adult should drive. BRING MEDICINES: CARE ADVICE given per Rectal Bleeding (Adult) guideline. GO TO ED NOW: Comments User: Sanjuanita Janie, Cazares, RN Date/Time Titus Time): 02/13/2024 10:23:15 AM Caller stated he would call his dr tomorrow. Referrals GO TO FACILITY REFUSED

## 2024-02-15 ENCOUNTER — Encounter: Payer: Self-pay | Admitting: Internal Medicine

## 2024-02-15 ENCOUNTER — Ambulatory Visit: Admitting: Internal Medicine

## 2024-02-15 VITALS — BP 126/82 | HR 76 | Temp 98.0°F | Resp 18 | Ht 68.5 in | Wt 274.2 lb

## 2024-02-15 DIAGNOSIS — E119 Type 2 diabetes mellitus without complications: Secondary | ICD-10-CM | POA: Diagnosis not present

## 2024-02-15 DIAGNOSIS — N401 Enlarged prostate with lower urinary tract symptoms: Secondary | ICD-10-CM | POA: Diagnosis not present

## 2024-02-15 DIAGNOSIS — K625 Hemorrhage of anus and rectum: Secondary | ICD-10-CM

## 2024-02-15 LAB — CBC WITH DIFFERENTIAL/PLATELET
Basophils Absolute: 0.1 10*3/uL (ref 0.0–0.1)
Basophils Relative: 0.9 % (ref 0.0–3.0)
Eosinophils Absolute: 0.4 10*3/uL (ref 0.0–0.7)
Eosinophils Relative: 6 % — ABNORMAL HIGH (ref 0.0–5.0)
HCT: 44.6 % (ref 39.0–52.0)
Hemoglobin: 14.8 g/dL (ref 13.0–17.0)
Lymphocytes Relative: 18.9 % (ref 12.0–46.0)
Lymphs Abs: 1.3 10*3/uL (ref 0.7–4.0)
MCHC: 33.3 g/dL (ref 30.0–36.0)
MCV: 88.4 fl (ref 78.0–100.0)
Monocytes Absolute: 0.4 10*3/uL (ref 0.1–1.0)
Monocytes Relative: 6 % (ref 3.0–12.0)
Neutro Abs: 4.9 10*3/uL (ref 1.4–7.7)
Neutrophils Relative %: 68.2 % (ref 43.0–77.0)
Platelets: 212 10*3/uL (ref 150.0–400.0)
RBC: 5.04 Mil/uL (ref 4.22–5.81)
RDW: 14.4 % (ref 11.5–15.5)
WBC: 7.1 10*3/uL (ref 4.0–10.5)

## 2024-02-15 LAB — URINALYSIS, ROUTINE W REFLEX MICROSCOPIC
Bilirubin Urine: NEGATIVE
Hgb urine dipstick: NEGATIVE
Ketones, ur: NEGATIVE
Leukocytes,Ua: NEGATIVE
Nitrite: NEGATIVE
RBC / HPF: NONE SEEN (ref 0–?)
Specific Gravity, Urine: 1.01 (ref 1.000–1.030)
Total Protein, Urine: NEGATIVE
Urine Glucose: NEGATIVE
Urobilinogen, UA: 2 — AB (ref 0.0–1.0)
pH: 6 (ref 5.0–8.0)

## 2024-02-15 LAB — MICROALBUMIN / CREATININE URINE RATIO
Creatinine,U: 73.7 mg/dL
Microalb Creat Ratio: 20.2 mg/g (ref 0.0–30.0)
Microalb, Ur: 1.5 mg/dL (ref 0.0–1.9)

## 2024-02-15 NOTE — Patient Instructions (Signed)
 Proceed with a colonoscopy at the end of the month  If you notice more bleeding: Seek medical attention. If the bleeding is severe, go to the ER.  GO TO THE LAB :  Get the blood work   Your results will be posted on MyChart with my comments  Next office visit for a physical exam in 3 months Please make an appointment before you leave today
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
## 2024-02-16 LAB — URINE CULTURE
MICRO NUMBER:: 16646665
Result:: NO GROWTH
SPECIMEN QUALITY:: ADEQUATE

## 2024-02-16 NOTE — Assessment & Plan Note (Signed)
 Red blood per rectum? Bleed twice last week, as described above. Source of the bleeding is unclear, rectal?  From the Fordyce lesions? Plan: He already has a colonoscopy scheduled for later this month, recommend to proceed.  Check CBC to rule out significant hemoglobin drop.  Check UA. Seek attention if symptoms resurface. DM: Well-controlled, check micro BPH:  Last PSA increased, urine culture negative, was rec to cont  Detrol , Flomax  added, plan is to recheck PSA in few months. RTC 3 months

## 2024-02-18 ENCOUNTER — Ambulatory Visit: Payer: Self-pay | Admitting: Internal Medicine

## 2024-02-19 ENCOUNTER — Other Ambulatory Visit: Payer: Self-pay | Admitting: Internal Medicine

## 2024-02-26 ENCOUNTER — Other Ambulatory Visit: Payer: Self-pay | Admitting: Internal Medicine

## 2024-02-29 LAB — HM COLONOSCOPY

## 2024-03-03 ENCOUNTER — Ambulatory Visit: Payer: Self-pay

## 2024-03-03 NOTE — Telephone Encounter (Signed)
 FYI Only or Action Required?: FYI only for provider.  Patient was last seen in primary care on 02/15/2024 by Amon Aloysius BRAVO, MD.  Called Nurse Triage reporting Hearing Problem.  Symptoms began x1 year, worsening x 2 weeks.  Interventions attempted: Nothing.  Symptoms are: bilateral partial hearing loss gradually worsening.  Triage Disposition: See PCP Within 2 Weeks  Patient/caregiver understands and will follow disposition?: Yes         Copied from CRM [id number]. Topic: Clinical - Red Word Triage >> Mar 03, 2024  1:14 PM Rosina BIRCH wrote: Red Word that prompted transfer to Nurse Triage: patient called stating he has been having hearing loss for two weeks and he believe it Is ear wax buildup Reason for Disposition  ALL other adults with decreased hearing that has gradually decreased  Answer Assessment - Initial Assessment Questions 1. DESCRIPTION: What type of hearing problem are you having? Describe it for me. (e.g., complete hearing loss, partial loss)     Patient states he gets earwax build up. Partial loss.  2. LOCATION: One or both ears? If one, ask: Which ear?     Bilateral.  3. SEVERITY: Can you hear anything? If Yes, ask: What can you hear? (e.g., ticking watch, whisper, talking)     Muffled, he states he is constantly having to ask people to repeat what they say in conversation.  4. ONSET: When did this begin? Did it start suddenly or come on gradually?     Gradually over the past year and the last 2 weeks has worsened.  5. PATTERN: Does this come and go, or has it been constant since it started?     Constant.  6. PAIN: Is there any pain in your ear(s)?  (Scale 0-10; or none, mild, moderate, severe)     No.  7. CAUSE: What do you think is causing this hearing problem?     Earwax build up/impaction.  8. OTHER SYMPTOMS: Do you have any other symptoms? (e.g., dizziness, ringing in ears)     Patient denies fevers, dizziness, ringing in ears.  9.  PREGNANCY: Is there any chance you are pregnant? When was your last menstrual period?     N/A.  Protocols used: Hearing Loss or Change-A-AH

## 2024-03-08 ENCOUNTER — Ambulatory Visit (INDEPENDENT_AMBULATORY_CARE_PROVIDER_SITE_OTHER): Admitting: Internal Medicine

## 2024-03-08 ENCOUNTER — Encounter: Payer: Self-pay | Admitting: Internal Medicine

## 2024-03-08 VITALS — BP 120/74 | HR 76 | Temp 97.6°F | Resp 18 | Ht 68.5 in | Wt 272.2 lb

## 2024-03-08 DIAGNOSIS — H9193 Unspecified hearing loss, bilateral: Secondary | ICD-10-CM | POA: Diagnosis not present

## 2024-03-08 DIAGNOSIS — Z23 Encounter for immunization: Secondary | ICD-10-CM

## 2024-03-08 NOTE — Assessment & Plan Note (Signed)
 Hearing loss: Gradual over the past several months without tinnitus.  May need hearing aids.  Referred to audiology. Red blood per rectum: See LOV, resolved.  Had a colonoscopy Preventive care: Tdap today RTC scheduled for 05/17/2024
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
## 2024-03-15 ENCOUNTER — Telehealth: Payer: Self-pay | Admitting: Allergy

## 2024-03-15 ENCOUNTER — Other Ambulatory Visit: Payer: Self-pay | Admitting: Allergy

## 2024-03-15 DIAGNOSIS — J3089 Other allergic rhinitis: Secondary | ICD-10-CM

## 2024-03-15 MED ORDER — QNASL 80 MCG/ACT NA AERS: 1.0000 | INHALATION_SPRAY | Freq: Two times a day (BID) | NASAL | 1 refills | Status: AC

## 2024-03-15 NOTE — Progress Notes (Signed)
 83mo supply sent with a refill for Qnasl  to listed pharmacy

## 2024-03-15 NOTE — Telephone Encounter (Signed)
 Sent to pharmacy

## 2024-03-15 NOTE — Telephone Encounter (Signed)
 Ed called and stated he is in urgent need of his Q-nasal to be refilled in a 3 month supply. [facility] on the [address] of [address]. Best contact [phone]

## 2024-03-16 ENCOUNTER — Other Ambulatory Visit: Payer: Self-pay | Admitting: Internal Medicine

## 2024-03-17 ENCOUNTER — Ambulatory Visit (INDEPENDENT_AMBULATORY_CARE_PROVIDER_SITE_OTHER): Admitting: Allergy

## 2024-03-17 ENCOUNTER — Other Ambulatory Visit: Payer: Self-pay

## 2024-03-17 ENCOUNTER — Encounter: Payer: Self-pay | Admitting: Allergy

## 2024-03-17 VITALS — BP 132/76 | HR 79 | Temp 98.4°F | Resp 18

## 2024-03-17 DIAGNOSIS — K219 Gastro-esophageal reflux disease without esophagitis: Secondary | ICD-10-CM

## 2024-03-17 DIAGNOSIS — J3089 Other allergic rhinitis: Secondary | ICD-10-CM

## 2024-03-17 DIAGNOSIS — R0982 Postnasal drip: Secondary | ICD-10-CM

## 2024-03-17 DIAGNOSIS — J4540 Moderate persistent asthma, uncomplicated: Secondary | ICD-10-CM | POA: Diagnosis not present

## 2024-03-17 MED ORDER — AZELASTINE HCL 0.1 % NA SOLN: 2.0000 | Freq: Two times a day (BID) | NASAL | 5 refills | Status: AC | PRN

## 2024-03-17 NOTE — Progress Notes (Signed)
 Follow-up Note  RE: James Puglia Thaker Sr. MRN: [id number] DOB: 1951/05/16 Date of Office Visit: 03/17/2024   History of present illness: James Bogue Dowdell Sr. is a 73 y.o. male presenting today for concern for thrush.  He has history of asthma, reflux, and chronic rhinitis.  He was last seen in the office on 11/03/2023 by myself. Discussed the use of AI scribe software for clinical note transcription with the patient, who gave verbal consent to proceed.  He has experienced chronic sinus drainage for years, necessitating the expectoration of mucus lots of times a day where he spits out mucus. The condition is substantial, with periods of exacerbation and remission over the years.  He is concerned he may have either thrush or might be his reflux.   He currently uses Qnasal, a steroid spray, to manage sinus inflammation. He recalls using azelastine  spray in the past, which helped control the drainage, and he did not experience the current problem during that time. He does not currently use azelastine  and is unsure if he has any at home.  His current medications include Qnasal, ryvent , montelukast , and Breo. He does rinse/brush tongue daily after Breo use.      Review of systems: 10pt ROS negative unless noted above in HPI   Past medical/social/surgical/family history have been reviewed and are unchanged unless specifically indicated below.  No changes  Medication List: Current Outpatient Medications  Medication Sig Dispense Refill   albuterol  (VENTOLIN  HFA) 108 (90 Base) MCG/ACT inhaler Inhale 2 puffs into the lungs every 6 (six) hours as needed for wheezing or shortness of breath. 18 g 1   atorvastatin  (LIPITOR) 40 MG tablet Take 1 tablet (40 mg total) by mouth at bedtime. 90 tablet 1   Beclomethasone Dipropionate  (QNASL ) 80 MCG/ACT AERS Place 1 spray into both nostrils 2 (two) times daily. 31.8 g 1   BREO ELLIPTA  200-25 MCG/ACT AEPB INHALE 1 PUFF INTO THE LUNGS DAILY 60  each 3   Carbinoxamine  Maleate 4 MG TABS Take 1-2 tablets (4-8 mg total) by mouth 2 (two) times daily as needed (Allergies). 120 tablet 5   Continuous Glucose Receiver (FREESTYLE LIBRE 3 READER) DEVI 1 each by Does not apply route daily. Use with sensors to check blood glucose continuously 1 each 0   Continuous Glucose Sensor (FREESTYLE LIBRE 3 SENSOR) MISC Place 1 sensor on the skin every 14 days. Use to check glucose continuously 2 each 2   esomeprazole  (NEXIUM ) 40 MG capsule Take 1 capsule (40 mg total) by mouth daily before breakfast. 90 capsule 1   famotidine  (PEPCID ) 20 MG tablet TAKE 1 TABLET(20 MG) BY MOUTH DAILY 90 tablet 1   gabapentin  (NEURONTIN ) 600 MG tablet Take 1 tablet (600 mg total) by mouth in the morning, at noon, in the evening, and at bedtime. 360 tablet 1   levothyroxine  (SYNTHROID ) 175 MCG tablet Take 1 tablet (175 mcg total) by mouth daily before breakfast. 90 tablet 1   losartan  (COZAAR ) 100 MG tablet Take 1 tablet (100 mg total) by mouth daily. 90 tablet 1   meloxicam  (MOBIC ) 15 MG tablet Take 1 tablet (15 mg total) by mouth daily as needed for pain. 30 tablet 0   metFORMIN  (GLUCOPHAGE ) 1000 MG tablet Take 1 tablet (1,000 mg total) by mouth 2 (two) times daily with a meal. 180 tablet 1   montelukast  (SINGULAIR ) 10 MG tablet TAKE 1 TABLET(10 MG) BY MOUTH AT BEDTIME 90 tablet 2   pregabalin  (LYRICA ) 150 MG capsule TAKE  1 CAPSULE(150 MG) BY MOUTH TWICE DAILY 180 capsule 1   Semaglutide , 1 MG/DOSE, (OZEMPIC , 1 MG/DOSE,) 4 MG/3ML SOPN Inject 1 mg into the skin once a week. 3 mL 3   tamsulosin  (FLOMAX ) 0.4 MG CAPS capsule Take 1 capsule (0.4 mg total) by mouth daily after supper. 90 capsule 1   tolterodine  (DETROL  LA) 4 MG 24 hr capsule Take 1 capsule (4 mg total) by mouth daily. 90 capsule 1   azelastine  (ASTELIN ) 0.1 % nasal spray Place 2 sprays into both nostrils 2 (two) times daily as needed (nasal drainage). Use in each nostril as directed 30 mL 5   No current  facility-administered medications for this visit.     Known medication allergies: Allergies  Allergen Reactions   Gabapentin  Other (See Comments)    Fuzzy headed w/ high doses   Lisinopril  Cough   Solifenacin Other (See Comments)    dizziness   Myrbetriq [Mirabegron] Rash     Physical examination: Blood pressure 132/76, pulse 79, temperature 98.4 F (36.9 C), resp. rate 18, SpO2 94%.  General: Alert, interactive, in no acute distress. HEENT: PERRLA, post-pharynx non erythematous, no evidence of thrush. Neck: Supple without lymphadenopathy. Lungs: Clear to auscultation without wheezing, rhonchi or rales. {no increased work of breathing. CV: Normal S1, S2 without murmurs. Abdomen: Nondistended, nontender. Skin: Warm and dry, without lesions or rashes. Extremities:  No clubbing, cyanosis or edema. Neuro:   Grossly intact.  Diagnostics/Labs:  Spirometry: FEV1: 2.28L 70%, FVC: 3.38L 77% predicted. Ratio if reduced for demographic  Assessment and plan:   Rhinitis - use Azelastine  2 sprays each nostril twice a day as needed for runny nose/throat clearing/spitting control.    - use Qnasl  1 spray twice a day for nasal congestion control.  Use for 1-2 weeks at a time before stopping once congestion improves.  - continue Ryvent  (carbinaxomine) 6mg  1 tab twice a day at this time.  - continue Montelukast  10mg  daily. - if symptoms not improving with above regimen then would recommend skin testing to environmental allergens.  Will need to hold antihistamine for at least 3 days prior to any skin testing visit  Reflux - continue Famotidine  and Esomeprazole  for reflux control  Asthma - continue Breo 200mcg 1 puff daily.   - have access to albuterol  inhaler 2 puffs OR albuterol  1 vial via nebulier every 4 hours as needed for cough/wheeze/shortness of breath/chest tightness.  May use 15-20 minutes prior to activity.   Monitor frequency of use.    Control goals:  Full participation in  all desired activities (may need albuterol  before activity) Albuterol  use two time or less a week on average (not counting use with activity) Cough interfering with sleep two time or less a month Oral steroids no more than once a year No hospitalizations  Return in about 6 months or sooner if needed  I appreciate the opportunity to take part in Eleanor's care. Please do not hesitate to contact me with questions.  Sincerely,   Danita Brain, MD Allergy/Immunology [facility]

## 2024-03-17 NOTE — Patient Instructions (Addendum)
 Rhinitis - use Azelastine  2 sprays each nostril twice a day as needed for runny nose/throat clearing/spitting control.    - use Qnasl  1 spray twice a day for nasal congestion control.  Use for 1-2 weeks at a time before stopping once congestion improves.  - continue Ryvent  (carbinaxomine) 6mg  1 tab twice a day at this time.  - continue Montelukast  10mg  daily. - if symptoms not improving with above regimen then would recommend skin testing to environmental allergens.  Will need to hold antihistamine for at least 3 days prior to any skin testing visit  Reflux - continue Famotidine  and Esomeprazole  for reflux control  Asthma - continue Breo 200mcg 1 puff daily.   - have access to albuterol  inhaler 2 puffs OR albuterol  1 vial via nebulier every 4 hours as needed for cough/wheeze/shortness of breath/chest tightness.  May use 15-20 minutes prior to activity.   Monitor frequency of use.    Control goals:  Full participation in all desired activities (may need albuterol  before activity) Albuterol  use two time or less a week on average (not counting use with activity) Cough interfering with sleep two time or less a month Oral steroids no more than once a year No hospitalizations  Return in about 6 months or sooner if needed

## 2024-03-20 NOTE — Addendum Note (Signed)
 Addended by: NANCEE JON SAILOR on: 03/20/2024 05:56 PM   Modules accepted: Orders

## 2024-05-17 ENCOUNTER — Encounter: Payer: Self-pay | Admitting: Internal Medicine

## 2024-05-17 ENCOUNTER — Telehealth: Payer: Self-pay

## 2024-05-17 ENCOUNTER — Ambulatory Visit: Admitting: Internal Medicine

## 2024-05-17 VITALS — BP 138/82 | HR 58 | Temp 98.1°F | Resp 18 | Ht 68.5 in | Wt 275.0 lb

## 2024-05-17 DIAGNOSIS — G4733 Obstructive sleep apnea (adult) (pediatric): Secondary | ICD-10-CM

## 2024-05-17 DIAGNOSIS — E1142 Type 2 diabetes mellitus with diabetic polyneuropathy: Secondary | ICD-10-CM | POA: Diagnosis not present

## 2024-05-17 DIAGNOSIS — Z0001 Encounter for general adult medical examination with abnormal findings: Secondary | ICD-10-CM

## 2024-05-17 DIAGNOSIS — M25551 Pain in right hip: Secondary | ICD-10-CM

## 2024-05-17 DIAGNOSIS — Z79899 Other long term (current) drug therapy: Secondary | ICD-10-CM

## 2024-05-17 DIAGNOSIS — I10 Essential (primary) hypertension: Secondary | ICD-10-CM

## 2024-05-17 DIAGNOSIS — G629 Polyneuropathy, unspecified: Secondary | ICD-10-CM

## 2024-05-17 DIAGNOSIS — Z23 Encounter for immunization: Secondary | ICD-10-CM

## 2024-05-17 DIAGNOSIS — E079 Disorder of thyroid, unspecified: Secondary | ICD-10-CM | POA: Diagnosis not present

## 2024-05-17 DIAGNOSIS — Z7984 Long term (current) use of oral hypoglycemic drugs: Secondary | ICD-10-CM

## 2024-05-17 DIAGNOSIS — Z0000 Encounter for general adult medical examination without abnormal findings: Secondary | ICD-10-CM | POA: Diagnosis not present

## 2024-05-17 NOTE — Patient Instructions (Addendum)
 GO TO THE LAB :  Get the blood work    Then, go to the front desk for the checkout Please make an appointment for a checkup in 4 months    Recommend to take metformin  as recommended and also get Ozempic  regularly     Please read more detailed instructions below   TYPE 2 DIABETES MELLITUS: Your diabetes is managed with metformin  and occasional use of Ozempic . -Continue taking metformin  as prescribed. - Use Ozempic  weekly -We will check your A1c to assess your blood sugar control.  PERIPHERAL NEUROPATHY: You have nerve pain in your extremities managed with pregabalin  and gabapentin . -Continue taking pregabalin  and gabapentin  as prescribed.  HYPERTENSION: Your high blood pressure is well controlled with losartan . -Continue taking losartan  as prescribed. -We will check your kidney function with a BMP test.  HYPERLIPIDEMIA: You have high cholesterol managed with atorvastatin . -Continue taking atorvastatin  as prescribed. -We will check your lipid levels with a FLP test.  HYPOTHYROIDISM: Your low thyroid  function is managed with Synthroid . -Continue taking Synthroid  as prescribed. -We will check your thyroid  function with a TSH test.  OBSTRUCTIVE SLEEP APNEA: You have sleep apnea and use a CPAP machine, which has not been used regularly due to malfunction. -We will refer you to a sleep apnea specialist for CPAP management.  NOCTURIA: You wake up frequently at night to urinate, with some improvement on tamsulosin . We are checking your PSA and perhaps refer you back to the urology  RIGHT HIP PAIN: Continue working with the orthopedic doctor    GENERAL HEALTH MAINTENANCE: Routine adult wellness visit with no new issues identified. Awaiting colonoscopy report. Family history of heart disease and colon cancer. -Obtain colonoscopy report from [place]. -Administer flu shot. -Recommend COVID booster, RSV, and pneumonia vaccines.                      Contains text  generated by [employer].                                 Contains text generated by [employer].

## 2024-05-17 NOTE — Telephone Encounter (Signed)
 FYI. Made a note not to refill Lyrica  until UDS is collected.

## 2024-05-17 NOTE — Telephone Encounter (Signed)
 Patient refused UDS today, orders have been reordered and placed as future
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
## 2024-05-18 ENCOUNTER — Encounter: Payer: Self-pay | Admitting: Internal Medicine

## 2024-05-18 ENCOUNTER — Ambulatory Visit: Payer: Self-pay | Admitting: Internal Medicine

## 2024-05-18 DIAGNOSIS — Z0000 Encounter for general adult medical examination without abnormal findings: Secondary | ICD-10-CM | POA: Insufficient documentation

## 2024-05-18 DIAGNOSIS — E079 Disorder of thyroid, unspecified: Secondary | ICD-10-CM

## 2024-05-18 LAB — BASIC METABOLIC PANEL WITH GFR
BUN: 24 mg/dL — ABNORMAL HIGH (ref 6–23)
CO2: 29 meq/L (ref 19–32)
Calcium: 9.1 mg/dL (ref 8.4–10.5)
Chloride: 101 meq/L (ref 96–112)
Creatinine, Ser: 1.05 mg/dL (ref 0.40–1.50)
GFR: 69.82 mL/min (ref >60.00)
Glucose, Bld: 99 mg/dL (ref 70–99)
Potassium: 4.2 meq/L (ref 3.5–5.1)
Sodium: 140 meq/L (ref 135–145)

## 2024-05-18 LAB — LIPID PANEL
Cholesterol: 121 mg/dL (ref 0–200)
HDL: 45.1 mg/dL (ref >39.00)
LDL Cholesterol: 54 mg/dL (ref 0–99)
NonHDL: 75.93
Total CHOL/HDL Ratio: 3
Triglycerides: 111 mg/dL (ref 0.0–149.0)
VLDL: 22.2 mg/dL (ref 0.0–40.0)

## 2024-05-18 LAB — HEMOGLOBIN A1C: Hgb A1c MFr Bld: 6.2 % (ref 4.6–6.5)

## 2024-05-18 LAB — TSH: TSH: 0.13 u[IU]/mL — ABNORMAL LOW (ref 0.35–5.50)

## 2024-05-18 LAB — PSA: PSA: 3.65 ng/mL (ref 0.10–4.00)

## 2024-05-18 MED ORDER — LEVOTHYROXINE SODIUM 150 MCG PO TABS: 150.0000 ug | ORAL_TABLET | Freq: Every day | ORAL | 0 refills | Status: DC

## 2024-05-18 NOTE — Assessment & Plan Note (Signed)
 Here for CPX Tdap 2025 Pneumonia shot 2016, 2017.    Shingrix x 1, recommend booster Flu shot today Recommend COVID booster; RSV , PNM 20 CCS: Virtual colonoscopy 08/06/2017: Poor quality. Sigmoidoscopy 09/16/2017: Large polyp, pathology-tubular adenoma, excised Pt reports had a cscope ~ 03/2024, will get report Prostate cancer screening: See comments under BPH, checking a PSA.  Low threshold for urology referral Labs: BMP FLP A1c PSA

## 2024-05-18 NOTE — Assessment & Plan Note (Signed)
 Here for CPX Other issues: Type 2 diabetes mellitus Type 2 diabetes mellitus managed with metformin . Infrequent use of Ozempic . Check  A1c test. - Continue metformin .  Recommend Ozempic  weekly.  Check A1c Peripheral neuropathy Peripheral neuropathy managed with pregabalin  and gabapentin .No change Hypertension Hypertension well controlled with losartan . Plan to monitor renal function with BMP. Hyperlipidemia Hyperlipidemia managed with atorvastatin . Last liver function tests were normal. Check FLP. Hypothyroidism Hypothyroidism managed with Synthroid . Plan to monitor thyroid  function with TSH test. Obstructive sleep apnea Obstructive sleep apnea with recent CPAP usage issues due to equipment problems. Needs referral to sleep apnea specialist-- sent  BPH: See LOV, had nocturia, PSA 6.4 on May 2025, urine culture was negative.  He was prescribed Flomax  for nocturia and urinary frequency.  Symptoms are essentially unchanged.  Will check a PSA; low threshold to refer to urology Right hip pain Chronic right hip pain with severe episodes rated 7-9/10. Previous cortisone injection provided partial relief.  Has seen Ortho and follow-up is in about 3 weeks.  MRI planned if symptoms persist.  Recommend to continue working with orthopedics RTC 4 months

## 2024-05-19 ENCOUNTER — Encounter: Payer: Self-pay | Admitting: Internal Medicine

## 2024-05-19 LAB — HM DIABETES EYE EXAM

## 2024-05-22 ENCOUNTER — Other Ambulatory Visit: Payer: Self-pay | Admitting: Internal Medicine

## 2024-05-22 ENCOUNTER — Other Ambulatory Visit: Payer: Self-pay | Admitting: Allergy

## 2024-06-12 ENCOUNTER — Encounter: Payer: Self-pay | Admitting: Internal Medicine

## 2024-06-13 ENCOUNTER — Other Ambulatory Visit: Payer: Self-pay | Admitting: Internal Medicine

## 2024-06-13 DIAGNOSIS — K219 Gastro-esophageal reflux disease without esophagitis: Secondary | ICD-10-CM

## 2024-06-14 ENCOUNTER — Other Ambulatory Visit: Payer: Self-pay | Admitting: Orthopaedic Surgery

## 2024-06-14 DIAGNOSIS — M545 Low back pain, unspecified: Secondary | ICD-10-CM

## 2024-06-19 ENCOUNTER — Ambulatory Visit
Admission: RE | Admit: 2024-06-19 | Discharge: 2024-06-19 | Disposition: A | Discharge status: Home / Self Care | Source: Ambulatory Visit | Attending: Orthopaedic Surgery | Admitting: Orthopaedic Surgery

## 2024-06-19 DIAGNOSIS — M545 Low back pain, unspecified: Secondary | ICD-10-CM

## 2024-06-23 ENCOUNTER — Telehealth: Payer: Self-pay | Admitting: Internal Medicine

## 2024-06-23 NOTE — Telephone Encounter (Signed)
 Tried calling Pt again, calls still goes straight to voicemail and voicemail is full.

## 2024-06-23 NOTE — Telephone Encounter (Signed)
 Tried calling Pt to make him aware unable to refill until he comes by to drop off UDS sample. Call goes straight to voicemail and voicemail box is full.

## 2024-06-23 NOTE — Telephone Encounter (Signed)
 Copied from CRM #[id number]. Topic: Clinical - Medication Refill >> Jun 23, 2024 12:16 PM Alexandria E wrote: Medication: pregabalin  (LYRICA ) 150 MG capsule  Has the patient contacted their pharmacy? No (Agent: If no, request that the patient contact the pharmacy for the refill. If patient does not wish to contact the pharmacy document the reason why and proceed with request.) (Agent: If yes, when and what did the pharmacy advise?)  This is the patient's preferred pharmacy:  [id number], [place] - [address] AT [facility] [address] [place] [address] Phone: [phone] Fax: [phone]   Is this the correct pharmacy for this prescription? Yes If no, delete pharmacy and type the correct one.   Has the prescription been filled recently? No  Is the patient out of the medication? No, patient has enough until Monday 11/10, but he is really hoping this medication could get sent over today, if doable. Did advised of the potential 3 business day turnaround time.  Has the patient been seen for an appointment in the last year OR does the patient have an upcoming appointment? Yes  Can we respond through MyChart? Yes  Agent: Please be advised that Rx refills may take up to 3 business days. We ask that you follow-up with your pharmacy.

## 2024-06-23 NOTE — Telephone Encounter (Signed)
 Will call Pt- unable to refill until we receive UDS (note from 05/17/24- Pt refused UDS at time of OV).

## 2024-06-26 ENCOUNTER — Other Ambulatory Visit: Payer: Self-pay | Admitting: Family

## 2024-06-26 MED ORDER — PREGABALIN 150 MG PO CAPS: 150.0000 mg | ORAL_CAPSULE | Freq: Two times a day (BID) | ORAL | 0 refills | Status: DC

## 2024-06-26 NOTE — Telephone Encounter (Signed)
 Pt refused UDS at OV on 05/17/24. PCP also sent him a mychart message stating he would be unable to refill meds until he can give us  a UDS. (See below)  Amon Aloysius BRAVO, MD to [facility] Ed     05/19/24  9:28 AM Ed, Is important you do the urinary test requested.  Without it I can't continue prescribing some of your medications. Please call and make a laboratory appointment and provide a urine sample. JP  This MyChart message has not been read.

## 2024-06-26 NOTE — Telephone Encounter (Addendum)
 Spoke w/ Pt- informed that we are unable to refill Lyrica  until he comes by the office to give a UDS (he refused at his last visit on 05/17/24, Pt states this is incorrect and he never refused anything- see telephone note from Chiquita Sayres). I reminded him that he is under contract for this medication because it is controlled. Pt requested to speak directly with Dr. Amon- I informed him that he is on medical leave until around 07/31/24, Pt states you're telling me that you have no way to contact Dr. Amon at all again informed him that Dr. Amon is on medical leave, I would not be reaching out to him during recovery. Pt states he is now living out of the area in [facility] but has plans to come back to [place] next Monday 07/03/2024. He is requesting that enough Lyrica  be sent to his pharmacy, [facility]. Made him aware that I would send a message to Pad[facility] and would let him know when I receive a response.

## 2024-06-26 NOTE — Telephone Encounter (Signed)
 Copied from CRM [id number]. Topic: Clinical - Prescription Issue >> Jun 26, 2024  2:29 PM Melissa C wrote: Reason for CRM: patient is waiting on refill for medication refill that was ordered on 11/7. He stated this is a medication that he needs (not emergently but he won't be able to sleep). I saw that correspondence went through regarding this on Friday so I placed patient on hold and contacted the office. After asking a nurse, the office let me know that because patient refused UDS at time of last visit, they would be unable to grant him a refill or just a small refill until he is able to come into the office as he is currently out of town. Patient was unhappy upon hearing this and I let him know that I would get a message to his doctor regarding his concerns and have his doctor give him a call as soon as possible. Patient was grateful for that and looks forward to hearing from doctor.

## 2024-06-27 NOTE — Telephone Encounter (Unsigned)
 Copied from CRM [id number]. Topic: General - Other >> Jun 27, 2024  9:29 AM Rea ORN wrote: Reason for CRM: James Henson returned missed call from James Henson. James Henson advised of James Henson's note. James Henson stated James Henson will call back to schedule lab appt. James Henson also wanted to express his  heartfelt gratitude for the practice working with him.

## 2024-06-27 NOTE — Telephone Encounter (Signed)
 LMOM informing Pt that [facility] did send a short term refill. He must come Monday for UDS for any further refills after this time, he is also due to recheck his TSH, we can do this on the same day. Instructed him to call office to schedule lab appt. Orders are already in chart.

## 2024-06-27 NOTE — Telephone Encounter (Signed)
 Pt called back, scheduled lab appt

## 2024-07-03 ENCOUNTER — Other Ambulatory Visit (INDEPENDENT_AMBULATORY_CARE_PROVIDER_SITE_OTHER)

## 2024-07-03 DIAGNOSIS — G629 Polyneuropathy, unspecified: Secondary | ICD-10-CM

## 2024-07-03 DIAGNOSIS — E079 Disorder of thyroid, unspecified: Secondary | ICD-10-CM | POA: Diagnosis not present

## 2024-07-03 DIAGNOSIS — Z79899 Other long term (current) drug therapy: Secondary | ICD-10-CM

## 2024-07-04 LAB — TSH: TSH: 2.51 u[IU]/mL (ref 0.35–5.50)

## 2024-07-05 LAB — DRUG MONITORING PANEL 375977 , URINE

## 2024-07-05 LAB — DM TEMPLATE

## 2024-07-25 ENCOUNTER — Other Ambulatory Visit: Payer: Self-pay | Admitting: Internal Medicine

## 2024-07-25 ENCOUNTER — Other Ambulatory Visit: Payer: Self-pay | Admitting: Allergy

## 2024-07-26 ENCOUNTER — Other Ambulatory Visit: Payer: Self-pay | Admitting: *Deleted

## 2024-07-26 ENCOUNTER — Other Ambulatory Visit (HOSPITAL_COMMUNITY): Payer: Self-pay

## 2024-07-26 ENCOUNTER — Telehealth: Payer: Self-pay | Admitting: Allergy

## 2024-07-26 ENCOUNTER — Telehealth: Payer: Self-pay

## 2024-07-26 MED ORDER — CARBINOXAMINE MALEATE 6 MG PO TABS: 1.0000 | ORAL_TABLET | Freq: Two times a day (BID) | ORAL | 5 refills | Status: AC

## 2024-07-26 NOTE — Telephone Encounter (Signed)
 Now have approval- showing as $0.00!

## 2024-07-26 NOTE — Telephone Encounter (Signed)
 James Henson called and stated that the insurance had denied approval on his Ryvent , and he is unsure as to why as it has always been approved before. He stated this medication is one that he needs and wanted someone to please look into this for him.

## 2024-07-26 NOTE — Telephone Encounter (Signed)
*  AA  Pharmacy Patient Advocate Encounter   Received notification from Pt Calls Messages that prior authorization for Ryvent  6mg   is required/requested.   Insurance verification completed.   The patient is insured through [facility] ADVANTAGE/RX ADVANCE.   Per test claim: PA required; PA started via CoverMyMeds. KEY [id number] . Waiting for clinical questions to populate.

## 2024-07-26 NOTE — Telephone Encounter (Signed)
 I fixed the prescription, it is generic Ryvent  6mg  2 times daily.

## 2024-07-26 NOTE — Telephone Encounter (Signed)
Called patient and informed. Patient verbalized understanding.  

## 2024-07-28 NOTE — Telephone Encounter (Signed)
 Information regarding your request Prior Authorization duplicate/approved

## 2024-08-08 ENCOUNTER — Telehealth: Payer: Self-pay | Admitting: Internal Medicine

## 2024-08-08 NOTE — Telephone Encounter (Signed)
 PDMP okay, Rx sent

## 2024-08-08 NOTE — Telephone Encounter (Signed)
 Requesting: Lyrica  150mg  Contract: 05/17/24 UDS:07/03/24 Last Visit: 05/17/24 Next Visit: 09/18/24 Last Refill: 06/26/24 #60 and 0RF   Please Advise

## 2024-08-11 ENCOUNTER — Other Ambulatory Visit: Payer: Self-pay | Admitting: Allergy

## 2024-08-14 ENCOUNTER — Other Ambulatory Visit: Payer: Self-pay | Admitting: *Deleted

## 2024-08-14 MED ORDER — BREO ELLIPTA 200-25 MCG/ACT IN AEPB: 1.0000 | INHALATION_SPRAY | Freq: Every day | RESPIRATORY_TRACT | 1 refills | Status: AC

## 2024-08-20 ENCOUNTER — Other Ambulatory Visit: Payer: Self-pay | Admitting: Internal Medicine

## 2024-08-24 ENCOUNTER — Telehealth: Payer: Self-pay

## 2024-08-24 NOTE — Telephone Encounter (Signed)
 Spoke w/ Pt- informed I have his [employer] ID number but do not have a copy of his red, white and blue Medicare card. Pt verbalized understanding.

## 2024-08-24 NOTE — Telephone Encounter (Signed)
 Copied from CRM [id number]. Topic: General - Other >> Aug 24, 2024  9:56 AM Thersia BROCKS wrote: Reason for CRM: Patient called in states he needs his medicare number , would like someone to give him a callback with this information

## 2024-09-05 ENCOUNTER — Ambulatory Visit: Payer: PPO | Admitting: *Deleted

## 2024-09-05 VITALS — Ht 68.5 in | Wt 275.0 lb

## 2024-09-05 DIAGNOSIS — Z0000 Encounter for general adult medical examination without abnormal findings: Secondary | ICD-10-CM | POA: Diagnosis not present

## 2024-09-05 NOTE — Progress Notes (Signed)
 I have reviewed and agree with [facility] documentation.  Aloysius Mech, MD
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
## 2024-09-05 NOTE — Patient Instructions (Addendum)
 Mr. Malenfant,  Thank you for taking the time for your Medicare Wellness Visit. I appreciate your continued commitment to your health goals. Please review the care plan we discussed, and feel free to reach out if I can assist you further.  Please note that Annual Wellness Visits do not include a physical exam. Some assessments may be limited, especially if the visit was conducted virtually. If needed, we may recommend an in-person follow-up with your provider.  Ongoing Care Seeing your primary care provider every 3 to 6 months helps us  monitor your health and provide consistent, personalized care.   Dr Amon: I cancelled your appointment on 09/18/24 as you requested.  Please call us  at [phone] to reschedule at your earliest convenience unless you are able to establish with a new doctor closer to home.  Medicare AWV: Please call to schedule your next one on or after 09/06/2025 if you have not established with a new doctor closer to your home.  Referrals If a referral was made during today's visit and you haven't received any updates within two weeks, please contact the referred provider directly to check on the status.  Recommended Screenings:   Health Maintenance  Topic Date Due   Complete foot exam   Never done   Hepatitis C Screening  Never done   Zoster (Shingles) Vaccine (2 of 2) 10/22/2022   Kidney health urinalysis for diabetes  08/17/2024   Hemoglobin A1C  11/15/2024   Yearly kidney function blood test for diabetes  05/17/2025   Eye exam for diabetics  05/19/2025   Medicare Annual Wellness Visit  09/05/2025   Colon Cancer Screening  03/01/2027   DTaP/Tdap/Td vaccine (4 - Td or Tdap) 03/08/2034   Pneumococcal Vaccine for age over 31  Completed   Flu Shot  Completed   Meningitis B Vaccine  Aged Out   COVID-19 Vaccine  Discontinued       09/05/2024    1:17 PM  Advanced Directives  Does Patient Have a Medical Advance Directive? Yes  Type of Estate Agent  of [place];Living will  Does patient want to make changes to medical advance directive? No - Patient declined  Copy of Healthcare Power of Attorney in Chart? No - copy requested  Would patient like information on creating a medical advance directive? No - Patient declined    Vision: Annual vision screenings are recommended for early detection of glaucoma, cataracts, and diabetic retinopathy. These exams can also reveal signs of chronic conditions such as diabetes and high blood pressure.  Dental: Annual dental screenings help detect early signs of oral cancer, gum disease, and other conditions linked to overall health, including heart disease and diabetes.  Please see the attached documents for additional preventive care recommendations.

## 2024-09-18 ENCOUNTER — Ambulatory Visit: Admitting: Internal Medicine

## 2024-09-19 ENCOUNTER — Other Ambulatory Visit: Payer: Self-pay | Admitting: Internal Medicine
